# Patient Record
Sex: Female | Born: 1975 | Race: White | Hispanic: Yes | Smoking: Current some day smoker
Health system: Southern US, Community
[De-identification: ages and names within clinical notes are randomized; demographics above are authoritative.]

## PROBLEM LIST (undated history)

## (undated) DIAGNOSIS — F4310 Post-traumatic stress disorder, unspecified: Secondary | ICD-10-CM

## (undated) DIAGNOSIS — T7840XA Allergy, unspecified, initial encounter: Secondary | ICD-10-CM

## (undated) DIAGNOSIS — F329 Major depressive disorder, single episode, unspecified: Secondary | ICD-10-CM

## (undated) DIAGNOSIS — Z8619 Personal history of other infectious and parasitic diseases: Secondary | ICD-10-CM

## (undated) DIAGNOSIS — M461 Sacroiliitis, not elsewhere classified: Secondary | ICD-10-CM

## (undated) DIAGNOSIS — M8580 Other specified disorders of bone density and structure, unspecified site: Secondary | ICD-10-CM

## (undated) DIAGNOSIS — M797 Fibromyalgia: Secondary | ICD-10-CM

## (undated) DIAGNOSIS — M4200 Juvenile osteochondrosis of spine, site unspecified: Secondary | ICD-10-CM

## (undated) DIAGNOSIS — J301 Allergic rhinitis due to pollen: Secondary | ICD-10-CM

## (undated) DIAGNOSIS — G43909 Migraine, unspecified, not intractable, without status migrainosus: Secondary | ICD-10-CM

## (undated) DIAGNOSIS — R7303 Prediabetes: Secondary | ICD-10-CM

## (undated) DIAGNOSIS — F32A Depression, unspecified: Secondary | ICD-10-CM

## (undated) DIAGNOSIS — Z9189 Other specified personal risk factors, not elsewhere classified: Secondary | ICD-10-CM

## (undated) DIAGNOSIS — M7580 Other shoulder lesions, unspecified shoulder: Secondary | ICD-10-CM

## (undated) DIAGNOSIS — F509 Eating disorder, unspecified: Secondary | ICD-10-CM

## (undated) DIAGNOSIS — M1990 Unspecified osteoarthritis, unspecified site: Secondary | ICD-10-CM

## (undated) HISTORY — DX: Post-traumatic stress disorder, unspecified: F43.10

## (undated) HISTORY — PX: WISDOM TOOTH EXTRACTION: SHX21

## (undated) HISTORY — DX: Allergic rhinitis due to pollen: J30.1

## (undated) HISTORY — DX: Personal history of other infectious and parasitic diseases: Z86.19

## (undated) HISTORY — DX: Major depressive disorder, single episode, unspecified: F32.9

## (undated) HISTORY — PX: OVARIAN CYST REMOVAL: SHX89

## (undated) HISTORY — PX: DILATION AND CURETTAGE OF UTERUS: SHX78

## (undated) HISTORY — DX: Depression, unspecified: F32.A

## (undated) HISTORY — DX: Prediabetes: R73.03

## (undated) HISTORY — DX: Allergy, unspecified, initial encounter: T78.40XA

## (undated) HISTORY — DX: Other shoulder lesions, unspecified shoulder: M75.80

## (undated) HISTORY — DX: Migraine, unspecified, not intractable, without status migrainosus: G43.909

## (undated) HISTORY — DX: Other specified personal risk factors, not elsewhere classified: Z91.89

## (undated) HISTORY — DX: Eating disorder, unspecified: F50.9

## (undated) HISTORY — PX: ACHILLES TENDON SURGERY: SHX542

---

## 1997-09-14 ENCOUNTER — Emergency Department (HOSPITAL_COMMUNITY): Admission: EM | Admit: 1997-09-14 | Discharge: 1997-09-14 | Payer: Self-pay | Admitting: Emergency Medicine

## 1997-09-26 ENCOUNTER — Emergency Department (HOSPITAL_COMMUNITY): Admission: EM | Admit: 1997-09-26 | Discharge: 1997-09-26 | Payer: Self-pay | Admitting: Emergency Medicine

## 1997-12-25 ENCOUNTER — Inpatient Hospital Stay (HOSPITAL_COMMUNITY): Admission: AD | Admit: 1997-12-25 | Discharge: 1997-12-25 | Payer: Self-pay | Admitting: *Deleted

## 1997-12-28 ENCOUNTER — Emergency Department (HOSPITAL_COMMUNITY): Admission: EM | Admit: 1997-12-28 | Discharge: 1997-12-28 | Payer: Self-pay | Admitting: Emergency Medicine

## 1998-02-04 ENCOUNTER — Emergency Department (HOSPITAL_COMMUNITY): Admission: EM | Admit: 1998-02-04 | Discharge: 1998-02-04 | Payer: Self-pay | Admitting: Emergency Medicine

## 1998-02-09 ENCOUNTER — Emergency Department (HOSPITAL_COMMUNITY): Admission: EM | Admit: 1998-02-09 | Discharge: 1998-02-10 | Payer: Self-pay | Admitting: Emergency Medicine

## 1998-11-11 ENCOUNTER — Encounter: Payer: Self-pay | Admitting: Internal Medicine

## 1998-11-11 ENCOUNTER — Inpatient Hospital Stay (HOSPITAL_COMMUNITY): Admission: EM | Admit: 1998-11-11 | Discharge: 1998-11-13 | Payer: Self-pay | Admitting: Internal Medicine

## 1999-01-06 ENCOUNTER — Emergency Department (HOSPITAL_COMMUNITY): Admission: EM | Admit: 1999-01-06 | Discharge: 1999-01-06 | Payer: Self-pay | Admitting: Emergency Medicine

## 1999-01-31 ENCOUNTER — Other Ambulatory Visit: Admission: RE | Admit: 1999-01-31 | Discharge: 1999-01-31 | Payer: Self-pay | Admitting: *Deleted

## 1999-04-06 ENCOUNTER — Inpatient Hospital Stay (HOSPITAL_COMMUNITY): Admission: EM | Admit: 1999-04-06 | Discharge: 1999-04-09 | Payer: Self-pay | Admitting: *Deleted

## 1999-04-06 ENCOUNTER — Emergency Department (HOSPITAL_COMMUNITY): Admission: EM | Admit: 1999-04-06 | Discharge: 1999-04-06 | Payer: Self-pay | Admitting: Emergency Medicine

## 1999-05-24 ENCOUNTER — Emergency Department (HOSPITAL_COMMUNITY): Admission: EM | Admit: 1999-05-24 | Discharge: 1999-05-24 | Payer: Self-pay | Admitting: Emergency Medicine

## 1999-05-25 ENCOUNTER — Ambulatory Visit (HOSPITAL_COMMUNITY): Admission: RE | Admit: 1999-05-25 | Discharge: 1999-05-25 | Payer: Self-pay | Admitting: Emergency Medicine

## 1999-08-12 ENCOUNTER — Emergency Department (HOSPITAL_COMMUNITY): Admission: EM | Admit: 1999-08-12 | Discharge: 1999-08-12 | Payer: Self-pay | Admitting: Emergency Medicine

## 1999-09-05 ENCOUNTER — Inpatient Hospital Stay (HOSPITAL_COMMUNITY): Admission: AD | Admit: 1999-09-05 | Discharge: 1999-09-05 | Payer: Self-pay | Admitting: Obstetrics & Gynecology

## 2000-02-06 ENCOUNTER — Other Ambulatory Visit: Admission: RE | Admit: 2000-02-06 | Discharge: 2000-02-06 | Payer: Self-pay | Admitting: *Deleted

## 2000-02-23 ENCOUNTER — Emergency Department (HOSPITAL_COMMUNITY): Admission: EM | Admit: 2000-02-23 | Discharge: 2000-02-24 | Payer: Self-pay | Admitting: Emergency Medicine

## 2000-02-24 ENCOUNTER — Encounter: Payer: Self-pay | Admitting: Emergency Medicine

## 2000-03-02 ENCOUNTER — Ambulatory Visit (HOSPITAL_COMMUNITY): Admission: RE | Admit: 2000-03-02 | Discharge: 2000-03-02 | Payer: Self-pay | Admitting: Emergency Medicine

## 2000-03-02 ENCOUNTER — Encounter: Payer: Self-pay | Admitting: Emergency Medicine

## 2000-04-01 ENCOUNTER — Emergency Department (HOSPITAL_COMMUNITY): Admission: EM | Admit: 2000-04-01 | Discharge: 2000-04-01 | Payer: Self-pay | Admitting: Internal Medicine

## 2000-07-19 ENCOUNTER — Emergency Department (HOSPITAL_COMMUNITY): Admission: EM | Admit: 2000-07-19 | Discharge: 2000-07-19 | Payer: Self-pay | Admitting: Emergency Medicine

## 2000-08-16 ENCOUNTER — Emergency Department (HOSPITAL_COMMUNITY): Admission: EM | Admit: 2000-08-16 | Discharge: 2000-08-16 | Payer: Self-pay | Admitting: Emergency Medicine

## 2000-10-04 ENCOUNTER — Inpatient Hospital Stay (HOSPITAL_COMMUNITY): Admission: AD | Admit: 2000-10-04 | Discharge: 2000-10-04 | Payer: Self-pay | Admitting: Obstetrics & Gynecology

## 2000-10-07 ENCOUNTER — Inpatient Hospital Stay (HOSPITAL_COMMUNITY): Admission: AD | Admit: 2000-10-07 | Discharge: 2000-10-07 | Payer: Self-pay | Admitting: Obstetrics

## 2000-10-07 ENCOUNTER — Encounter: Payer: Self-pay | Admitting: Obstetrics

## 2000-10-08 ENCOUNTER — Encounter (INDEPENDENT_AMBULATORY_CARE_PROVIDER_SITE_OTHER): Payer: Self-pay

## 2000-10-08 ENCOUNTER — Inpatient Hospital Stay (HOSPITAL_COMMUNITY): Admission: AD | Admit: 2000-10-08 | Discharge: 2000-10-08 | Payer: Self-pay | Admitting: Obstetrics & Gynecology

## 2000-10-15 ENCOUNTER — Inpatient Hospital Stay (HOSPITAL_COMMUNITY): Admission: AD | Admit: 2000-10-15 | Discharge: 2000-10-15 | Payer: Self-pay | Admitting: Obstetrics & Gynecology

## 2000-10-22 ENCOUNTER — Inpatient Hospital Stay (HOSPITAL_COMMUNITY): Admission: AD | Admit: 2000-10-22 | Discharge: 2000-10-22 | Payer: Self-pay | Admitting: Obstetrics & Gynecology

## 2001-02-25 ENCOUNTER — Inpatient Hospital Stay (HOSPITAL_COMMUNITY): Admission: AD | Admit: 2001-02-25 | Discharge: 2001-02-25 | Payer: Self-pay | Admitting: Obstetrics

## 2001-03-01 ENCOUNTER — Inpatient Hospital Stay (HOSPITAL_COMMUNITY): Admission: AD | Admit: 2001-03-01 | Discharge: 2001-03-01 | Payer: Self-pay | Admitting: Obstetrics

## 2001-03-29 ENCOUNTER — Inpatient Hospital Stay (HOSPITAL_COMMUNITY): Admission: AD | Admit: 2001-03-29 | Discharge: 2001-03-29 | Payer: Self-pay | Admitting: Obstetrics

## 2001-05-13 ENCOUNTER — Emergency Department (HOSPITAL_COMMUNITY): Admission: EM | Admit: 2001-05-13 | Discharge: 2001-05-13 | Payer: Self-pay | Admitting: Emergency Medicine

## 2001-06-02 ENCOUNTER — Inpatient Hospital Stay (HOSPITAL_COMMUNITY): Admission: AD | Admit: 2001-06-02 | Discharge: 2001-06-02 | Payer: Self-pay | Admitting: Obstetrics

## 2001-06-16 ENCOUNTER — Inpatient Hospital Stay (HOSPITAL_COMMUNITY): Admission: AD | Admit: 2001-06-16 | Discharge: 2001-06-16 | Payer: Self-pay | Admitting: Obstetrics

## 2001-06-25 ENCOUNTER — Inpatient Hospital Stay (HOSPITAL_COMMUNITY): Admission: AD | Admit: 2001-06-25 | Discharge: 2001-06-25 | Payer: Self-pay | Admitting: Obstetrics

## 2001-07-17 ENCOUNTER — Inpatient Hospital Stay (HOSPITAL_COMMUNITY): Admission: AD | Admit: 2001-07-17 | Discharge: 2001-07-17 | Payer: Self-pay | Admitting: Obstetrics

## 2001-07-25 ENCOUNTER — Inpatient Hospital Stay (HOSPITAL_COMMUNITY): Admission: AD | Admit: 2001-07-25 | Discharge: 2001-07-27 | Payer: Self-pay | Admitting: Obstetrics

## 2001-07-25 ENCOUNTER — Encounter (INDEPENDENT_AMBULATORY_CARE_PROVIDER_SITE_OTHER): Payer: Self-pay | Admitting: Specialist

## 2001-11-06 ENCOUNTER — Encounter: Payer: Self-pay | Admitting: *Deleted

## 2001-11-06 ENCOUNTER — Emergency Department (HOSPITAL_COMMUNITY): Admission: EM | Admit: 2001-11-06 | Discharge: 2001-11-06 | Payer: Self-pay | Admitting: *Deleted

## 2001-11-22 ENCOUNTER — Emergency Department (HOSPITAL_COMMUNITY): Admission: EM | Admit: 2001-11-22 | Discharge: 2001-11-22 | Payer: Self-pay | Admitting: Emergency Medicine

## 2001-12-12 ENCOUNTER — Emergency Department (HOSPITAL_COMMUNITY): Admission: EM | Admit: 2001-12-12 | Discharge: 2001-12-12 | Payer: Self-pay | Admitting: Emergency Medicine

## 2002-01-22 ENCOUNTER — Emergency Department (HOSPITAL_COMMUNITY): Admission: EM | Admit: 2002-01-22 | Discharge: 2002-01-22 | Payer: Self-pay | Admitting: Emergency Medicine

## 2002-01-22 ENCOUNTER — Encounter: Payer: Self-pay | Admitting: Emergency Medicine

## 2002-02-04 ENCOUNTER — Encounter: Admission: RE | Admit: 2002-02-04 | Discharge: 2002-02-04 | Payer: Self-pay | Admitting: *Deleted

## 2002-02-24 ENCOUNTER — Emergency Department (HOSPITAL_COMMUNITY): Admission: EM | Admit: 2002-02-24 | Discharge: 2002-02-24 | Payer: Self-pay | Admitting: Emergency Medicine

## 2002-03-14 ENCOUNTER — Encounter: Admission: RE | Admit: 2002-03-14 | Discharge: 2002-03-14 | Payer: Self-pay | Admitting: *Deleted

## 2002-05-02 ENCOUNTER — Encounter: Admission: RE | Admit: 2002-05-02 | Discharge: 2002-05-02 | Payer: Self-pay | Admitting: *Deleted

## 2002-06-01 ENCOUNTER — Encounter: Admission: RE | Admit: 2002-06-01 | Discharge: 2002-06-01 | Payer: Self-pay | Admitting: *Deleted

## 2002-06-16 ENCOUNTER — Emergency Department (HOSPITAL_COMMUNITY): Admission: EM | Admit: 2002-06-16 | Discharge: 2002-06-16 | Payer: Self-pay | Admitting: Emergency Medicine

## 2002-08-14 ENCOUNTER — Emergency Department (HOSPITAL_COMMUNITY): Admission: EM | Admit: 2002-08-14 | Discharge: 2002-08-14 | Payer: Self-pay | Admitting: Emergency Medicine

## 2002-08-14 ENCOUNTER — Encounter: Payer: Self-pay | Admitting: Emergency Medicine

## 2002-09-16 ENCOUNTER — Encounter: Admission: RE | Admit: 2002-09-16 | Discharge: 2002-09-16 | Payer: Self-pay | Admitting: *Deleted

## 2002-10-31 ENCOUNTER — Emergency Department (HOSPITAL_COMMUNITY): Admission: EM | Admit: 2002-10-31 | Discharge: 2002-10-31 | Payer: Self-pay | Admitting: Emergency Medicine

## 2002-10-31 ENCOUNTER — Encounter: Payer: Self-pay | Admitting: Emergency Medicine

## 2003-10-11 ENCOUNTER — Emergency Department (HOSPITAL_COMMUNITY): Admission: EM | Admit: 2003-10-11 | Discharge: 2003-10-11 | Payer: Self-pay | Admitting: Emergency Medicine

## 2003-10-25 ENCOUNTER — Emergency Department (HOSPITAL_COMMUNITY): Admission: EM | Admit: 2003-10-25 | Discharge: 2003-10-25 | Payer: Self-pay | Admitting: Emergency Medicine

## 2003-10-28 ENCOUNTER — Ambulatory Visit: Payer: Self-pay | Admitting: Psychiatry

## 2003-10-28 ENCOUNTER — Inpatient Hospital Stay (HOSPITAL_COMMUNITY): Admission: AD | Admit: 2003-10-28 | Discharge: 2003-11-07 | Payer: Self-pay | Admitting: Psychiatry

## 2003-10-28 ENCOUNTER — Encounter: Payer: Self-pay | Admitting: Emergency Medicine

## 2003-11-02 ENCOUNTER — Encounter: Payer: Self-pay | Admitting: Internal Medicine

## 2003-11-11 ENCOUNTER — Emergency Department (HOSPITAL_COMMUNITY): Admission: EM | Admit: 2003-11-11 | Discharge: 2003-11-11 | Payer: Self-pay | Admitting: Emergency Medicine

## 2003-11-12 ENCOUNTER — Emergency Department (HOSPITAL_COMMUNITY): Admission: EM | Admit: 2003-11-12 | Discharge: 2003-11-12 | Payer: Self-pay | Admitting: Emergency Medicine

## 2006-12-30 ENCOUNTER — Inpatient Hospital Stay (HOSPITAL_COMMUNITY): Admission: RE | Admit: 2006-12-30 | Discharge: 2007-01-02 | Payer: Self-pay | Admitting: Obstetrics

## 2008-11-29 ENCOUNTER — Encounter: Admission: RE | Admit: 2008-11-29 | Discharge: 2008-11-29 | Payer: Self-pay | Admitting: Neurosurgery

## 2010-05-21 NOTE — Op Note (Signed)
NAME:  Gail Watkins, Gail Watkins              ACCOUNT NO.:  [id number]   MEDICAL RECORD NO.:  [id number]          PATIENT TYPE:  INP   LOCATION:  9199                          FACILITY:  [facility]   PHYSICIAN:  Kathreen Cosier, M.D.DATE OF BIRTH:  10-Jul-1975   DATE OF PROCEDURE:  12/30/2006  DATE OF DISCHARGE:                               OPERATIVE REPORT   PREOPERATIVE DIAGNOSES:  1. Previous cesarean section.  2. At term.  3. Desires repeat cesarean section.   POSTOPERATIVE DIAGNOSES:  1. Previous cesarean section.  2. At term.  3. Desires repeat cesarean section.   SURGEON:  Kathreen Cosier, M.D.   ANESTHESIA:  Spinal.   PROCEDURE IN DETAIL:  The patient was placed on the operating table in a  supine position.  The abdomen was prepped and draped.  The bladder  emptied with a Foley catheter.  A transverse suprapubic incision was  made through the old scar and carried down to the rectus fascia.  The  fascia was cleaned and incised the length of the incision.  The recti  muscles were retracted laterally.  The peritoneum was incised  longitudinally.   A transverse incision was made in the visceral peritoneum above the  bladder.  The bladder was mobilized inferiorly.  A transverse lower  uterine incision was made.  The patient was delivered from the left  occiput anterior position of a female with Apgar scores of 8 and 9 and  weighing 6 pounds 2 ounces.  The team was in attendance.  The fluid was  clear.  The placenta was posterior fundal, removed manually.  The cavity  was cleaned with dry laps.  The placenta was sent to labor and delivery.   The uterine cavity was closed in one layer with continuous suture of #1  chromic.  Hemostasis was satisfactory.  The bladder flap was reattached  with 2-0 chromic.  The uterus was contracted.  The tubes and ovaries  were normal.  The abdomen was closed in layers.  The peritoneum was  closed with a continuous suture of 0 chromic.  The fascia  was closed  with a continuous suture of 0 Dexon and the skin was closed with a  subcuticular stitch of 4-0 Monocryl.  Blood loss was 600 mL.           ______________________________  Kathreen Cosier, M.D.     BAM/MEDQ  D:  12/30/2006  T:  12/30/2006  Job:  [id number]

## 2010-05-24 NOTE — Discharge Summary (Signed)
NAME:  Gail Watkins, Gail Watkins              ACCOUNT NO.:  [id number]   MEDICAL RECORD NO.:  [id number]          PATIENT TYPE:  INP   LOCATION:  9107                          [facility]ACILITY:  [facility]   PHYSICIAN:  Kathreen Cosier, M.D.DATE OF BIRTH:  September 27, 1975   DATE OF ADMISSION:  12/30/2006  DATE OF DISCHARGE:  01/02/2007                               DISCHARGE SUMMARY   The patient is a 35 year old gravida 7, para 2-0-4-2.  [facility] January 08, 2007.  Positive GBS.  She had previous section and desired a repeat C-  section at term.   She was on:  1. Lexapro 20 mg daily.  2. Klonopin 1 mg b.i.d.   She had a history of:  1. Bipolar disorder.  2. Panic disorder.  3. Obsessive/compulsive disorder.  4. Juvenile kyphosis.  5. History of a previous uterine perforation.   She underwent a repeat low-transverse cesarean section on December 30, 2006 and had a 6 pounds 2 ounces female with Apgar's of 8 and 9.  Postpartum, she did well.   On admission her hemoglobin was 11.5, white count 12.1.  Post-op 8.9  hemoglobin, 11.9 white count.  PT PTT normal.  RPR nonreactive.  HIV  nonreactive.   She was discharged home on the 3rd postoperative day on a regular diet  and will return to see me in 6 weeks.   DISCHARGE DIAGNOSES:  Status post repeat low-transverse cesarean section  at term.           ______________________________  Kathreen Cosier, M.D.     BAM/MEDQ  D:  01/21/2007  T:  01/21/2007  Job:  [id number]

## 2010-05-24 NOTE — Procedures (Signed)
HISTORY:  This patient is 35 year old with a history of polysubstance abuse,  obsessive compulsive disorder, depression with history of seizures. The  patient is being evaluated for seizures at this point. This is again a  portable EEG recording. No skull infection noted.   MEDICATIONS:  Multivitamins, thiamine, Librium, Seroquel, Neurontin,  Remeron, Effexor, Vibramycin, Tylenol, Dulcolax.   EEG CLASSIFICATION:  Normal awake __________ .   DESCRIPTION OF RECORDING:  The background rhythm of this recording consists  of a fairly well-modulated medium amplitude alpha rhythm of 9 Hz that is  reactive to eye opening and closure.  As record progresses, the patient  appears to remain in the awakened state throughout the entirety of this  recording.  Photic stimulation was not performed.  Hyperventilation was  performed resulting in a very minimal buildup of background rhythm activity  without significant slowing seen.  At no time during the recording does  there appear to be evidence of spike or spike wave discharges or evidence of  focal slowing.  EKG monitor shows no evidence of cardiac arrhythmias with a  heart rate of 68.   IMPRESSION:  This is a normal EEG recording in the __________ state. No  evidence of ictal or interictal discharges were seen on today's evaluation.      ZOX:WRUE  D:  11/01/2003 17:45:17  T:  11/01/2003 18:15:34  Job #:  [id number]
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
## 2010-10-11 LAB — DIFFERENTIAL
Basophils Absolute: 0
Eosinophils Absolute: 0.1 — ABNORMAL LOW
Lymphocytes Relative: 16
Lymphs Abs: 1.9
Neutrophils Relative %: 76

## 2010-10-11 LAB — CBC
HCT: 26.5 — ABNORMAL LOW
HCT: 33.4 — ABNORMAL LOW
Hemoglobin: 11.5 — ABNORMAL LOW
MCHC: 34.5
MCV: 87.6
MCV: 88.2
Platelets: 152
Platelets: 193
RBC: 3.82 — ABNORMAL LOW
RDW: 12.6
RDW: 12.7
WBC: 11.9 — ABNORMAL HIGH
WBC: 12.1 — ABNORMAL HIGH

## 2010-10-11 LAB — PROTIME-INR
INR: 1
Prothrombin Time: 13.1

## 2010-10-11 LAB — RPR: RPR Ser Ql: NONREACTIVE

## 2015-01-20 ENCOUNTER — Encounter (HOSPITAL_COMMUNITY): Payer: Self-pay

## 2015-01-20 ENCOUNTER — Emergency Department (HOSPITAL_COMMUNITY): Payer: Medicaid Other

## 2015-01-20 ENCOUNTER — Emergency Department (HOSPITAL_COMMUNITY)
Admission: EM | Admit: 2015-01-20 | Discharge: 2015-01-20 | Discharge status: Against Medical Advice | Payer: Medicaid Other | Attending: Emergency Medicine | Admitting: Emergency Medicine

## 2015-01-20 DIAGNOSIS — F17200 Nicotine dependence, unspecified, uncomplicated: Secondary | ICD-10-CM | POA: Diagnosis not present

## 2015-01-20 DIAGNOSIS — W01198A Fall on same level from slipping, tripping and stumbling with subsequent striking against other object, initial encounter: Secondary | ICD-10-CM | POA: Diagnosis not present

## 2015-01-20 DIAGNOSIS — Y9383 Activity, rough housing and horseplay: Secondary | ICD-10-CM | POA: Diagnosis not present

## 2015-01-20 DIAGNOSIS — Y9289 Other specified places as the place of occurrence of the external cause: Secondary | ICD-10-CM | POA: Insufficient documentation

## 2015-01-20 DIAGNOSIS — M533 Sacrococcygeal disorders, not elsewhere classified: Secondary | ICD-10-CM

## 2015-01-20 DIAGNOSIS — S199XXA Unspecified injury of neck, initial encounter: Secondary | ICD-10-CM | POA: Insufficient documentation

## 2015-01-20 DIAGNOSIS — Z8739 Personal history of other diseases of the musculoskeletal system and connective tissue: Secondary | ICD-10-CM | POA: Diagnosis not present

## 2015-01-20 DIAGNOSIS — Y998 Other external cause status: Secondary | ICD-10-CM | POA: Diagnosis not present

## 2015-01-20 DIAGNOSIS — S3992XA Unspecified injury of lower back, initial encounter: Secondary | ICD-10-CM | POA: Insufficient documentation

## 2015-01-20 DIAGNOSIS — M545 Low back pain: Secondary | ICD-10-CM

## 2015-01-20 HISTORY — DX: Juvenile osteochondrosis of spine, site unspecified: M42.00

## 2015-01-20 HISTORY — DX: Fibromyalgia: M79.7

## 2015-01-20 MED ORDER — ACETAMINOPHEN 500 MG PO TABS: 500.0000 mg | ORAL_TABLET | Freq: Four times a day (QID) | ORAL | Status: DC | PRN

## 2015-01-20 MED ORDER — IBUPROFEN 400 MG PO TABS: 800.0000 mg | ORAL_TABLET | Freq: Once | ORAL | Status: DC

## 2015-01-20 MED ORDER — NAPROXEN 500 MG PO TABS: 500.0000 mg | ORAL_TABLET | Freq: Two times a day (BID) | ORAL | Status: DC

## 2015-01-20 NOTE — ED Notes (Signed)
Patient transported to X-ray 

## 2015-01-20 NOTE — ED Notes (Signed)
PA at bedside.
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
## 2015-01-20 NOTE — ED Notes (Signed)
Pt here for fall while "horse playing" with her son. She states she fell onto concrete floor and landed on her bottom and reports lower back pain. No head injury, no LOC. Pt states the fall made her a little nauseous immediately afterwards.

## 2015-01-20 NOTE — ED Notes (Signed)
RN went to room to obtain pt sticker for ordering of donut cushion; pt was upset and states she has a pillow and can get aleve at home; Pt states she was leaving; RN witnessed pt and family walking out exit; PA notified

## 2015-07-08 ENCOUNTER — Encounter (HOSPITAL_BASED_OUTPATIENT_CLINIC_OR_DEPARTMENT_OTHER): Payer: Self-pay | Admitting: *Deleted

## 2015-07-08 ENCOUNTER — Emergency Department (HOSPITAL_BASED_OUTPATIENT_CLINIC_OR_DEPARTMENT_OTHER)
Admission: EM | Admit: 2015-07-08 | Discharge: 2015-07-08 | Disposition: A | Discharge status: Home / Self Care | Payer: Medicaid Other | Attending: Emergency Medicine | Admitting: Emergency Medicine

## 2015-07-08 DIAGNOSIS — Z79899 Other long term (current) drug therapy: Secondary | ICD-10-CM | POA: Diagnosis not present

## 2015-07-08 DIAGNOSIS — Y9301 Activity, walking, marching and hiking: Secondary | ICD-10-CM | POA: Insufficient documentation

## 2015-07-08 DIAGNOSIS — M1990 Unspecified osteoarthritis, unspecified site: Secondary | ICD-10-CM | POA: Diagnosis not present

## 2015-07-08 DIAGNOSIS — S91312A Laceration without foreign body, left foot, initial encounter: Secondary | ICD-10-CM | POA: Diagnosis present

## 2015-07-08 DIAGNOSIS — X58XXXA Exposure to other specified factors, initial encounter: Secondary | ICD-10-CM | POA: Diagnosis not present

## 2015-07-08 DIAGNOSIS — Y999 Unspecified external cause status: Secondary | ICD-10-CM | POA: Insufficient documentation

## 2015-07-08 DIAGNOSIS — Z79818 Long term (current) use of other agents affecting estrogen receptors and estrogen levels: Secondary | ICD-10-CM | POA: Insufficient documentation

## 2015-07-08 DIAGNOSIS — F17200 Nicotine dependence, unspecified, uncomplicated: Secondary | ICD-10-CM | POA: Diagnosis not present

## 2015-07-08 DIAGNOSIS — Y929 Unspecified place or not applicable: Secondary | ICD-10-CM | POA: Diagnosis not present

## 2015-07-08 DIAGNOSIS — S91115A Laceration without foreign body of left lesser toe(s) without damage to nail, initial encounter: Secondary | ICD-10-CM | POA: Insufficient documentation

## 2015-07-08 DIAGNOSIS — Z791 Long term (current) use of non-steroidal anti-inflammatories (NSAID): Secondary | ICD-10-CM | POA: Insufficient documentation

## 2015-07-08 DIAGNOSIS — S81802A Unspecified open wound, left lower leg, initial encounter: Secondary | ICD-10-CM

## 2015-07-08 HISTORY — DX: Sacroiliitis, not elsewhere classified: M46.1

## 2015-07-08 HISTORY — DX: Other specified disorders of bone density and structure, unspecified site: M85.80

## 2015-07-08 HISTORY — DX: Unspecified osteoarthritis, unspecified site: M19.90

## 2015-07-08 NOTE — ED Notes (Signed)
Pt states that she had a fall in Jan and since then she states her "walking has been off" causing calluses on her left foot/and losing toe nails on both feet. C/o open area below 5th toe on pedal aspect of foot. States she is not able to get area to close. Has been that way for weeks.

## 2015-07-08 NOTE — Discharge Instructions (Signed)
Keep wound clean and dry. Limited mobility of the toes. Follow-up with your primary physician  SEEK MEDICAL CARE IF:  You develop unusual or increased swelling or redness around the wound.  You have increasing pain or tenderness.  There is increasing fluid (drainage) or a bad smelling drainage coming from the wound.   This information is not intended to replace advice given to you by your health care provider. Make sure you discuss any questions you have with your health care provider.   Document Released: 12/23/2004 Document Revised: 12/28/2012 Document Reviewed: 06/22/2012 Elsevier Interactive Patient Education [employer]2016 Elsevier Inc.

## 2015-07-08 NOTE — ED Provider Notes (Signed)
CSN: [id number]     Arrival date & time 07/08/15  1425 History  By signing my name below, I, Gail Watkins, attest that this documentation has been prepared under the direction and in the presence of Loren Raceravid Uel Davidow, MD. Electronically Signed: Alyssa GroveMartin Watkins, ED Scribe. 07/08/2015. 4:03 PM.   Chief Complaint  Patient presents with  . foot injury left     The history is provided by the patient. No language interpreter was used.    HPI Comments: Gail Watkins is a 40 y.o. female with PMHx of Sacroiliitis who presents to the Emergency Department complaining of laceration on her left little toe onset 3 weeks ago. Pt reports that the wound is a result of friction of a callus that she obtained earlier this year.  Patient states that the wound was healing but that she reopened it after getting her feet wet. No redness or warmth. No fever or chills. Denies any other injuries.   Past Medical History  Diagnosis Date  . Scheurmann's disease   . Fibromyalgia   . Sacroiliitis (HCC)   . Osteoarthritis   . Osteopenia    Past Surgical History  Procedure Laterality Date  . Cesarean section    . Cystectomy      removed from ovaries  . Dilation and curettage of uterus    . Ovarian cyst removal     No family history on file. Social History  Substance Use Topics  . Smoking status: Current Every Day Smoker  . Smokeless tobacco: None  . Alcohol Use: Yes     Comment: rare   OB History    No data available     Review of Systems  Constitutional: Negative for fever.  Musculoskeletal: Negative for arthralgias.  Skin: Positive for wound. Negative for color change and rash.  Neurological: Negative for weakness and numbness.  All other systems reviewed and are negative.   Allergies  Sulfa antibiotics; Vancomycin; Prednisone; Reglan; and Terbutaline  Home Medications   Prior to Admission medications   Medication Sig Start Date End Date Taking? Authorizing Provider  clonazePAM (KLONOPIN) 1 MG  tablet Take 1 mg by mouth 2 (two) times daily.   Yes Historical Provider, MD  Diclofenac Sodium (VOLTAREN PO) Take by mouth.   Yes Historical Provider, MD  ergocalciferol (VITAMIN D2) 50000 units capsule Take 50,000 Units by mouth once a week.   Yes Historical Provider, MD  escitalopram (LEXAPRO) 10 MG tablet Take 10 mg by mouth daily.   Yes Historical Provider, MD  norethindrone-ethinyl estradiol (JUNEL FE,GILDESS FE,LOESTRIN FE) 1-20 MG-MCG tablet Take 1 tablet by mouth daily.   Yes Historical Provider, MD  OxyCODONE HCl, Abuse Deter, (OXAYDO) 5 MG TABA Take by mouth.   Yes Historical Provider, MD  acetaminophen (TYLENOL) 500 MG tablet Take 1 tablet (500 mg total) by mouth every 6 (six) hours as needed. 01/20/15   Cheri FowlerKayla Rose, PA-C  naproxen (NAPROSYN) 500 MG tablet Take 1 tablet (500 mg total) by mouth 2 (two) times daily. 01/20/15   Kayla Rose, PA-C   BP 110/70 mmHg  Pulse 79  Temp(Src) 98.1 F (36.7 C)  Resp 18  Ht 6' (1.829 m)  Wt 185 lb (83.915 kg)  BMI 25.08 kg/m2  SpO2 100%  LMP 06/25/2015 Physical Exam  Constitutional: She is oriented to person, place, and time. She appears well-developed and well-nourished. No distress.  HENT:  Head: Normocephalic and atraumatic.  Mouth/Throat: Oropharynx is clear and moist.  Eyes: EOM are normal. Pupils are equal,  round, and reactive to light.  Neck: Normal range of motion. Neck supple.  Cardiovascular: Normal rate and regular rhythm.   Pulmonary/Chest: Effort normal and breath sounds normal.  Abdominal: Soft. Bowel sounds are normal.  Musculoskeletal: Normal range of motion. She exhibits no edema or tenderness.  Neurological: She is alert and oriented to person, place, and time.  Skin: Skin is warm and dry. No rash noted. No erythema.  Patient has 1 cm laceration to the base of the fifth digit of the left foot at the plantar surface. Appears to be chronic in nature and healing. No evidence of erythema, swelling or warmth.   Psychiatric: She  has a normal mood and affect. Her behavior is normal.  Nursing note and vitals reviewed.   ED Course  Procedures (including critical care time)  DIAGNOSTIC STUDIES: Oxygen Saturation is 100% on RA, normal by my interpretation.    COORDINATION OF CARE: 3:29 PM Discussed treatment plan with pt at bedside which includes keeping wound dry and clean as well as limiting mobility and pt agreed to plan.  Labs Review Labs Reviewed - No data to display  Imaging Review No results found. I have personally reviewed and evaluated these images and lab results as part of my medical decision-making.   EKG Interpretation None      MDM   Final diagnoses:  Non-healing wound of lower extremity, left, initial encounter    I personally performed the services described in this documentation, which was scribed in my presence. The recorded information has been reviewed and is accurate.   Patient with chronic wound to the left foot. No evidence of infection and it appears that she keeps reopening the wound with activity and getting her feet wet. She is advised to keep the area dry and clean. She should limit movement of the toes and follow-up with her primary physician. Wound was dressed in the emergency department. Return precautions given.  Loren Raceravid Rhett Najera, MD 07/08/15 [phone]

## 2015-07-08 NOTE — ED Notes (Signed)
Per pt report ongoing issue with cut between small toe and next to last toe on lt foot. Pt reports using new skin over the area, has seen pcp with no change in care, now burning, unable to walk and has been using more of her chronic pain medications.

## 2016-02-19 ENCOUNTER — Other Ambulatory Visit (HOSPITAL_COMMUNITY): Payer: Self-pay | Admitting: Advanced Practice Midwife

## 2016-02-19 DIAGNOSIS — R102 Pelvic and perineal pain: Secondary | ICD-10-CM

## 2016-02-28 ENCOUNTER — Ambulatory Visit (HOSPITAL_COMMUNITY)
Admission: RE | Admit: 2016-02-28 | Discharge: 2016-02-28 | Disposition: A | Discharge status: Home / Self Care | Payer: Medicaid Other | Source: Ambulatory Visit | Attending: Advanced Practice Midwife | Admitting: Advanced Practice Midwife

## 2016-02-28 DIAGNOSIS — M797 Fibromyalgia: Secondary | ICD-10-CM | POA: Insufficient documentation

## 2016-02-28 DIAGNOSIS — N854 Malposition of uterus: Secondary | ICD-10-CM | POA: Diagnosis not present

## 2016-02-28 DIAGNOSIS — R102 Pelvic and perineal pain: Secondary | ICD-10-CM | POA: Insufficient documentation

## 2016-02-28 DIAGNOSIS — F17200 Nicotine dependence, unspecified, uncomplicated: Secondary | ICD-10-CM | POA: Insufficient documentation

## 2016-04-01 ENCOUNTER — Ambulatory Visit (INDEPENDENT_AMBULATORY_CARE_PROVIDER_SITE_OTHER): Payer: Medicaid Other | Admitting: Obstetrics & Gynecology

## 2016-04-01 VITALS — BP 108/70 | HR 68 | Wt 187.5 lb

## 2016-04-01 DIAGNOSIS — R102 Pelvic and perineal pain: Secondary | ICD-10-CM | POA: Diagnosis present

## 2016-04-01 DIAGNOSIS — G8929 Other chronic pain: Secondary | ICD-10-CM | POA: Diagnosis not present
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
## 2016-04-02 LAB — POCT PREGNANCY, URINE: PREG TEST UR: NEGATIVE

## 2016-08-18 ENCOUNTER — Encounter: Payer: Self-pay | Admitting: Family Medicine

## 2016-08-18 ENCOUNTER — Ambulatory Visit (INDEPENDENT_AMBULATORY_CARE_PROVIDER_SITE_OTHER): Payer: BLUE CROSS/BLUE SHIELD | Admitting: Family Medicine

## 2016-08-18 VITALS — BP 124/80 | HR 91 | Temp 98.3°F | Ht 72.0 in | Wt 187.4 lb

## 2016-08-18 DIAGNOSIS — M4807 Spinal stenosis, lumbosacral region: Secondary | ICD-10-CM

## 2016-08-18 DIAGNOSIS — M545 Low back pain: Secondary | ICD-10-CM

## 2016-08-18 DIAGNOSIS — M461 Sacroiliitis, not elsewhere classified: Secondary | ICD-10-CM | POA: Diagnosis not present

## 2016-08-18 DIAGNOSIS — K589 Irritable bowel syndrome without diarrhea: Secondary | ICD-10-CM

## 2016-08-18 DIAGNOSIS — G8929 Other chronic pain: Secondary | ICD-10-CM

## 2016-08-18 DIAGNOSIS — F445 Conversion disorder with seizures or convulsions: Secondary | ICD-10-CM | POA: Diagnosis not present

## 2016-08-18 MED ORDER — MELOXICAM 7.5 MG PO TABS: 7.5000 mg | ORAL_TABLET | Freq: Two times a day (BID) | ORAL | 0 refills | Status: DC

## 2016-08-18 MED ORDER — TRAMADOL HCL 50 MG PO TABS: 50.0000 mg | ORAL_TABLET | Freq: Four times a day (QID) | ORAL | 1 refills | Status: DC | PRN

## 2016-08-18 MED ORDER — OMEPRAZOLE 40 MG PO CPDR: 40.0000 mg | DELAYED_RELEASE_CAPSULE | Freq: Every day | ORAL | 2 refills | Status: DC

## 2016-08-18 MED ORDER — DIAZEPAM 5 MG PO TABS: ORAL_TABLET | ORAL | 0 refills | Status: DC

## 2016-08-18 MED ORDER — DICYCLOMINE HCL 10 MG PO CAPS: 10.0000 mg | ORAL_CAPSULE | Freq: Three times a day (TID) | ORAL | 2 refills | Status: DC

## 2016-08-18 NOTE — Progress Notes (Addendum)
Chief Complaint  Patient presents with  . Establish Care    pt wants to be referred back to pain managment       New Patient Visit SUBJECTIVE: HPI: Gail Watkins is an 41 y.o.female who is being seen for establishing care.  The patient was previously seen at Dr. Kathrynn SpeedArvind. There was a miscommunication and she stopped seeing him.  Chronic back pain Long standing hx of back pain, used to have Medicaid. Saw pain management and he wanted MRI. For insurance to pay for this, she had to go through PT. Insurance did not cover PT. She denies any loss of bowel/bladder function. She is interested in having physical therapy. She would also like to have an MRI done. MRI was to rule out spinal stenosis. She did have a fall onto her tailbone that started this.  She has a history of nonepileptic seizures stemming from her PTSD. She has had a negative EEG in the past. Her previous neurologist lost his position as the previous health care organization was downsizing. She likely referral back to neurology. She needs a neurologist to say that her seizures are nonepileptic.   Allergies  Allergen Reactions  . Sulfa Antibiotics Anaphylaxis       . Vancomycin Anaphylaxis  . Prednisone     PTSD  . Reglan [Metoclopramide]     "it makes me feel like im crawling out of my skin"   . Terbutaline     "it makes me feel like im crawling out of my skin"     Past Medical History:  Diagnosis Date  . Allergy   . Depression   . Eating disorder   . Fibromyalgia   . Hay fever   . History of chicken pox   . History of fainting spells of unknown cause   . Migraine   . Osteoarthritis   . Osteopenia   . Sacroiliitis (HCC)   . Scheurmann's disease    Past Surgical History:  Procedure Laterality Date  . CESAREAN SECTION    . cystectomy     removed from ovaries  . DILATION AND CURETTAGE OF UTERUS    . OVARIAN CYST REMOVAL     Social History   Social History  . Marital status: Single   Social History Main  Topics  . Smoking status: Current Every Day Smoker  . Smokeless tobacco: Never Used  . Alcohol use Yes     Comment: rare  . Drug use: No   Family History  Problem Relation Age of Onset  . Alzheimer's disease Mother        Epo4+  . Microcephaly Father   . Heart attack Father   . Cancer Father        Prostate  . Stroke Father   . Diabetes Father   . Mental illness Father      Current Outpatient Prescriptions:  .  Diclofenac Sodium (VOLTAREN PO), Take by mouth., Disp: , Rfl:  .  dicyclomine (BENTYL) 10 MG capsule, Take 1 capsule (10 mg total) by mouth 4 (four) times daily -  before meals and at bedtime., Disp: 120 capsule, Rfl: 2 .  ergocalciferol (VITAMIN D2) 50000 units capsule, Take 50,000 Units by mouth once a week., Disp: , Rfl:  .  Melatonin 5 MG TABS, Take 10 tablets by mouth at bedtime., Disp: , Rfl:  .  meloxicam (MOBIC) 7.5 MG tablet, Take 1 tablet (7.5 mg total) by mouth 2 (two) times daily., Disp: 30 tablet, Rfl: 0 .  omeprazole (PRILOSEC) 40 MG capsule, Take 1 capsule (40 mg total) by mouth daily., Disp: 30 capsule, Rfl: 2 .  traMADol (ULTRAM) 50 MG tablet, Take 1 tablet (50 mg total) by mouth every 6 (six) hours as needed for moderate pain., Disp: 30 tablet, Rfl: 1 .  diazepam (VALIUM) 5 MG tablet, Take 1 tab 30 min prior to your MRI., Disp: 1 tablet, Rfl: 0  Patient's last menstrual period was 08/12/2016 (exact date).  ROS MSK: +SI joint pain, +LBP  Neuro: Denies weakness   OBJECTIVE: BP 124/80 (BP Location: Left Arm, Patient Position: Sitting, Cuff Size: Normal)   Pulse 91   Temp 98.3 F (36.8 C) (Oral)   Ht 6' (1.829 m)   Wt 187 lb 6.4 oz (85 kg)   LMP 08/12/2016 (Exact Date)   SpO2 98%   BMI 25.42 kg/m   Constitutional: -  VS reviewed -  Well developed, well nourished, appears stated age -  No apparent distress  Psychiatric: -  Oriented to person, place, and time -  Memory intact -  Affect and mood normal -  Fluent conversation, good eye  contact -  Judgment and insight age appropriate  Eye: -  Conjunctivae clear, no discharge -  Pupils symmetric, round, reactive to light  ENMT: -  MMM    Pharynx moist, no exudate, no erythema  Neck: -  No gross swelling, no palpable masses -  Thyroid midline, not enlarged, mobile, no palpable masses  Cardiovascular: -  RRR -  No LE edema  Respiratory: -  Normal respiratory effort, no accessory muscle use, no retraction -  Breath sounds equal, no wheezes, no ronchi, no crackles  Neurological:  -  CN II - XII grossly intact -  Sensation grossly intact to light touch, equal bilaterally -  2/4 patellar reflex, 1/4 calcaneal reflex, no clonus -  No cerebellar signs  Musculoskeletal: -  No clubbing, no cyanosis -  Gait normal -  5/5 strength throughout -  Mild TTP over b/l SI jt, worse on L -  +TTP over lumbar paraspinals around L4/5  Skin: -  No significant lesion on inspection -  Warm and dry to palpation   ASSESSMENT/PLAN: Psychogenic nonepileptic seizure - Plan: Ambulatory referral to Neurology  Spinal stenosis of lumbosacral region - Plan: MR Lumbar Spine W Wo Contrast  Chronic bilateral low back pain without sciatica - Plan: Ambulatory referral to Physical Therapy  Irritable bowel syndrome, unspecified type  Sacroiliitis (HCC) - Plan: Ambulatory referral to Physical Therapy  Patient instructed to sign release of records form from her previous PCP. Refer to PT, Neuro, MRI for pain management. She needs a neurologist to communicate to her pain provider that her seizures are non-epileptic.  I gave her resources for both psych and counseling services here. I am OK with tramadol for pain until she can be treated further by pain management. Patient should return for CPE at earliest convenience. The patient voiced understanding and agreement to the plan.  Greater than 38 minutes were spent face to face with the patient with greater than 50% of this time spent counseling on SI jt  pain, chronic low back pain, prognosis, follow up, anxiety, psychiatry and non-epileptic seizure management.     Jilda Roche [place], DO 08/18/16  5:31 PM

## 2016-08-18 NOTE — Patient Instructions (Addendum)
[facility] [address] [place], [place] [address] [phone]  [facility] [address] [place], [place] [address] [phone]  [facility] [address] [place], [place] [address] [phone]  Dr. Andee PolesParish McKinney [address] [place], [place] [address] [phone]  Please consider counseling. The medical literature and evidence-based guidelines support it. Contact [phone] to schedule an appointment or inquire about cost/insurance coverage.  If you do not hear anything about your Neurology referral in the next 1-2 weeks, call our office and ask for an update.

## 2016-08-23 ENCOUNTER — Ambulatory Visit (HOSPITAL_BASED_OUTPATIENT_CLINIC_OR_DEPARTMENT_OTHER): Payer: BLUE CROSS/BLUE SHIELD

## 2016-08-30 ENCOUNTER — Ambulatory Visit (HOSPITAL_BASED_OUTPATIENT_CLINIC_OR_DEPARTMENT_OTHER)
Admission: RE | Admit: 2016-08-30 | Discharge: 2016-08-30 | Disposition: A | Discharge status: Home / Self Care | Payer: BLUE CROSS/BLUE SHIELD | Source: Ambulatory Visit | Attending: Family Medicine | Admitting: Family Medicine

## 2016-08-30 DIAGNOSIS — G8929 Other chronic pain: Secondary | ICD-10-CM | POA: Diagnosis not present

## 2016-08-30 DIAGNOSIS — R200 Anesthesia of skin: Secondary | ICD-10-CM | POA: Diagnosis not present

## 2016-08-30 DIAGNOSIS — M4807 Spinal stenosis, lumbosacral region: Secondary | ICD-10-CM

## 2016-08-30 DIAGNOSIS — M545 Low back pain: Secondary | ICD-10-CM | POA: Diagnosis not present

## 2016-08-30 MED ORDER — GADOBENATE DIMEGLUMINE 529 MG/ML IV SOLN
15.0000 mL | Freq: Once | INTRAVENOUS | Status: AC | PRN
  Administered 2016-08-30: 15 mL via INTRAVENOUS

## 2016-09-03 ENCOUNTER — Ambulatory Visit (INDEPENDENT_AMBULATORY_CARE_PROVIDER_SITE_OTHER): Payer: BLUE CROSS/BLUE SHIELD | Admitting: Family Medicine

## 2016-09-03 ENCOUNTER — Encounter: Payer: Self-pay | Admitting: Family Medicine

## 2016-09-03 VITALS — BP 114/80 | HR 75 | Temp 98.6°F | Ht 72.0 in | Wt 188.1 lb

## 2016-09-03 DIAGNOSIS — M461 Sacroiliitis, not elsewhere classified: Secondary | ICD-10-CM

## 2016-09-03 DIAGNOSIS — F410 Panic disorder [episodic paroxysmal anxiety] without agoraphobia: Secondary | ICD-10-CM | POA: Diagnosis not present

## 2016-09-03 MED ORDER — METHYLPREDNISOLONE ACETATE 40 MG/ML IJ SUSP
80.0000 mg | Freq: Once | INTRAMUSCULAR | Status: AC
  Administered 2016-09-03: 80 mg via INTRAMUSCULAR

## 2016-09-03 MED ORDER — TRAMADOL HCL 50 MG PO TABS: 100.0000 mg | ORAL_TABLET | Freq: Two times a day (BID) | ORAL | 2 refills | Status: DC | PRN

## 2016-09-03 MED ORDER — HYDROXYZINE HCL 50 MG PO TABS: 50.0000 mg | ORAL_TABLET | Freq: Three times a day (TID) | ORAL | 0 refills | Status: DC | PRN

## 2016-09-03 NOTE — Progress Notes (Signed)
Chief Complaint  Patient presents with  . Results    MRI    Subjective: Patient is a 41 y.o. female here for f/u LBP.  She recently had an MRI that was normal. She is frustrated with the lack of answers and the steps her current pain provider is making her go through. The pain is worse over the tailbone on the left side. She is requesting an injection. Physical therapy is set up, however she has not have ample time to do this since her last visit. She tries to stretch at home, however she is sore for the next 3 days. Tramadol has been helpful, however and 100 mg doses twice a day. She is requesting to see another pain management provider. She is also requesting something for her nerves, specifically Klonopin.  ROS: MSK: +LBP/SI jt pain Neuro:+decreased sensation of foot  Family History  Problem Relation Age of Onset  . Alzheimer's disease Mother        Epo4+  . Microcephaly Father   . Heart attack Father   . Cancer Father        Prostate  . Stroke Father   . Diabetes Father   . Mental illness Father    Past Medical History:  Diagnosis Date  . Allergy   . Depression   . Eating disorder   . Fibromyalgia   . Hay fever   . History of chicken pox   . History of fainting spells of unknown cause   . Migraine   . Osteoarthritis   . Osteopenia   . Sacroiliitis (HCC)   . Scheurmann's disease    Allergies  Allergen Reactions  . Sulfa Antibiotics Anaphylaxis       . Vancomycin Anaphylaxis  . Prednisone     PTSD  . Reglan [Metoclopramide]     "it makes me feel like im crawling out of my skin"   . Terbutaline     "it makes me feel like im crawling out of my skin"     Current Outpatient Prescriptions:  .  Diclofenac Sodium (VOLTAREN PO), Take by mouth., Disp: , Rfl:  .  dicyclomine (BENTYL) 10 MG capsule, Take 1 capsule (10 mg total) by mouth 4 (four) times daily -  before meals and at bedtime., Disp: 120 capsule, Rfl: 2 .  Melatonin 5 MG TABS, Take 10 tablets by mouth  at bedtime., Disp: , Rfl:  .  meloxicam (MOBIC) 7.5 MG tablet, Take 1 tablet (7.5 mg total) by mouth 2 (two) times daily., Disp: 30 tablet, Rfl: 0 .  omeprazole (PRILOSEC) 40 MG capsule, Take 1 capsule (40 mg total) by mouth daily., Disp: 30 capsule, Rfl: 2 .  traMADol (ULTRAM) 50 MG tablet, Take 2 tablets (100 mg total) by mouth every 12 (twelve) hours as needed for moderate pain., Disp: 120 tablet, Rfl: 2 .  ergocalciferol (VITAMIN D2) 50000 units capsule, Take 50,000 Units by mouth once a week., Disp: , Rfl:  .  hydrOXYzine (ATARAX/VISTARIL) 50 MG tablet, Take 1 tablet (50 mg total) by mouth 3 (three) times daily as needed for anxiety., Disp: 90 tablet, Rfl: 0  Objective: BP 114/80 (BP Location: Left Arm, Patient Position: Sitting, Cuff Size: Normal)   Pulse 75   Temp 98.6 F (37 C) (Oral)   Ht 6' (1.829 m)   Wt 188 lb 2 oz (85.3 kg)   LMP 08/12/2016 (Exact Date)   SpO2 97%   BMI 25.51 kg/m  General: Awake, appears stated age Lungs: No accessory  muscle use MSK: +TTP over b/l SI jt, worse on L Psych: Age appropriate judgment and insight, normal affect and mood; tearful at times during exam  Procedure note; SI joint injection, b/l Verbal consent obtained. The PSIS's were palpated and demarcated with an otoscope speculum tip just medial to the side of interest. The area was cleaned with alcohol. Freeze spray was used.  The joint was entered and 40 mg Depomedrol with 2 mL of 1% lidocaine was injected. The area was then bandaged. The same was repeated on the opposite side. There were no complications noted.  The patient tolerated the procedure well.   Assessment and Plan: Sacroiliitis (HCC) - Plan: Ambulatory referral to Pain Clinic, traMADol (ULTRAM) 50 MG tablet  Panic attack - Plan: hydrOXYzine (ATARAX/VISTARIL) 50 MG tablet  Orders as above. OK to inc dose of Tramadol until she gets into pain. Cont NSAID. Try to stretch. PT ref in action. H1 blocker rather than benzo as  she is on partial Mu opioid agonist. She has psych resources.  Injections today immediately provided some relief. She will likely do well with future injections with pain.  The patient voiced understanding and agreement to the plan.  [name] [place], DO 09/03/16  12:10 PM

## 2016-09-03 NOTE — Patient Instructions (Signed)
If you do not hear anything about your referral in the next 1-2 weeks, call our office and ask for an update.  

## 2016-09-03 NOTE — Progress Notes (Signed)
Pre visit review using our clinic review tool, if applicable. No additional management support is needed unless otherwise documented below in the visit note. 

## 2016-09-03 NOTE — Addendum Note (Signed)
Addended by: Scharlene GlossEWING, ROBIN B on: 09/03/2016 04:17 PM   Modules accepted: Orders

## 2016-09-22 DIAGNOSIS — M9903 Segmental and somatic dysfunction of lumbar region: Secondary | ICD-10-CM | POA: Diagnosis not present

## 2016-09-22 DIAGNOSIS — M5416 Radiculopathy, lumbar region: Secondary | ICD-10-CM | POA: Diagnosis not present

## 2016-09-22 DIAGNOSIS — M9904 Segmental and somatic dysfunction of sacral region: Secondary | ICD-10-CM | POA: Diagnosis not present

## 2016-09-22 DIAGNOSIS — M9905 Segmental and somatic dysfunction of pelvic region: Secondary | ICD-10-CM | POA: Diagnosis not present

## 2016-09-24 DIAGNOSIS — M5416 Radiculopathy, lumbar region: Secondary | ICD-10-CM | POA: Diagnosis not present

## 2016-09-24 DIAGNOSIS — M9904 Segmental and somatic dysfunction of sacral region: Secondary | ICD-10-CM | POA: Diagnosis not present

## 2016-09-24 DIAGNOSIS — M9903 Segmental and somatic dysfunction of lumbar region: Secondary | ICD-10-CM | POA: Diagnosis not present

## 2016-09-24 DIAGNOSIS — M9905 Segmental and somatic dysfunction of pelvic region: Secondary | ICD-10-CM | POA: Diagnosis not present

## 2016-09-25 ENCOUNTER — Encounter: Payer: Self-pay | Admitting: Student in an Organized Health Care Education/Training Program

## 2016-09-25 ENCOUNTER — Ambulatory Visit
Payer: BLUE CROSS/BLUE SHIELD | Attending: Student in an Organized Health Care Education/Training Program | Admitting: Student in an Organized Health Care Education/Training Program

## 2016-09-25 VITALS — BP 127/77 | HR 64 | Temp 98.1°F | Resp 16 | Ht 76.0 in | Wt 185.0 lb

## 2016-09-25 DIAGNOSIS — F4310 Post-traumatic stress disorder, unspecified: Secondary | ICD-10-CM | POA: Diagnosis not present

## 2016-09-25 DIAGNOSIS — Z881 Allergy status to other antibiotic agents status: Secondary | ICD-10-CM | POA: Diagnosis not present

## 2016-09-25 DIAGNOSIS — M4698 Unspecified inflammatory spondylopathy, sacral and sacrococcygeal region: Secondary | ICD-10-CM | POA: Diagnosis not present

## 2016-09-25 DIAGNOSIS — M791 Myalgia: Secondary | ICD-10-CM | POA: Diagnosis not present

## 2016-09-25 DIAGNOSIS — M545 Low back pain: Secondary | ICD-10-CM | POA: Diagnosis not present

## 2016-09-25 DIAGNOSIS — G894 Chronic pain syndrome: Secondary | ICD-10-CM | POA: Insufficient documentation

## 2016-09-25 DIAGNOSIS — M7918 Myalgia, other site: Secondary | ICD-10-CM

## 2016-09-25 DIAGNOSIS — R531 Weakness: Secondary | ICD-10-CM | POA: Diagnosis not present

## 2016-09-25 DIAGNOSIS — M8580 Other specified disorders of bone density and structure, unspecified site: Secondary | ICD-10-CM | POA: Insufficient documentation

## 2016-09-25 DIAGNOSIS — M5124 Other intervertebral disc displacement, thoracic region: Secondary | ICD-10-CM | POA: Insufficient documentation

## 2016-09-25 DIAGNOSIS — M797 Fibromyalgia: Secondary | ICD-10-CM | POA: Insufficient documentation

## 2016-09-25 DIAGNOSIS — M1990 Unspecified osteoarthritis, unspecified site: Secondary | ICD-10-CM | POA: Insufficient documentation

## 2016-09-25 DIAGNOSIS — F339 Major depressive disorder, recurrent, unspecified: Secondary | ICD-10-CM | POA: Insufficient documentation

## 2016-09-25 DIAGNOSIS — M461 Sacroiliitis, not elsewhere classified: Secondary | ICD-10-CM | POA: Diagnosis not present

## 2016-09-25 DIAGNOSIS — F411 Generalized anxiety disorder: Secondary | ICD-10-CM | POA: Insufficient documentation

## 2016-09-25 DIAGNOSIS — G629 Polyneuropathy, unspecified: Secondary | ICD-10-CM | POA: Diagnosis not present

## 2016-09-25 DIAGNOSIS — M47818 Spondylosis without myelopathy or radiculopathy, sacral and sacrococcygeal region: Secondary | ICD-10-CM

## 2016-09-25 MED ORDER — GABAPENTIN 300 MG PO CAPS: 300.0000 mg | ORAL_CAPSULE | Freq: Three times a day (TID) | ORAL | 2 refills | Status: DC

## 2016-09-25 NOTE — Patient Instructions (Addendum)
1. UDS today 2. Bilateral SI joint injection  3. Pain Psych referral 4. Gabapentin 300 mg three times a day  Sacroiliac (SI) Joint Injection Patient Information  Description: The sacroiliac joint connects the scrum (very low back and tailbone) to the ilium (a pelvic bone which also forms half of the hip joint).  Normally this joint experiences very little motion.  When this joint becomes inflamed or unstable low back and or hip and pelvis pain may result.  Injection of this joint with local anesthetics (numbing medicines) and steroids can provide diagnostic information and reduce pain.  This injection is performed with the aid of x-ray guidance into the tailbone area while you are lying on your stomach.   You may experience an electrical sensation down the leg while this is being done.  You may also experience numbness.  We also may ask if we are reproducing your normal pain during the injection.  Conditions which may be treated SI injection:   Low back, buttock, hip or leg pain  Preparation for the Injection:  1. Do not eat any solid food or dairy products within 8 hours of your appointment.  2. You may drink clear liquids up to 3 hours before appointment.  Clear liquids include water, black coffee, juice or soda.  No milk or cream please. 3. You may take your regular medications, including pain medications with a sip of water before your appointment.  Diabetics should hold regular insulin (if take separately) and take 1/2 normal NPH dose the morning of the procedure.  Carry some sugar containing items with you to your appointment. 4. A driver must accompany you and be prepared to drive you home after your procedure. 5. Bring all of your current medications with you. 6. An IV may be inserted and sedation may be given at the discretion of the physician. 7. A blood pressure cuff, EKG and other monitors will often be applied during the procedure.  Some patients may need to have extra oxygen  administered for a short period.  8. You will be asked to provide medical information, including your allergies, prior to the procedure.  We must know immediately if you are taking blood thinners (like Coumadin/Warfarin) or if you are allergic to IV iodine contrast (dye).  We must know if you could possible be pregnant.  Possible side effects:   Bleeding from needle site  Infection (rare, may require surgery)  Nerve injury (rare)  Numbness & tingling (temporary)  A brief convulsion or seizure  Light-headedness (temporary)  Pain at injection site (several days)  Decreased blood pressure (temporary)  Weakness in the leg (temporary)   Call if you experience:   New onset weakness or numbness of an extremity below the injection site that last more than 8 hours.  Hives or difficulty breathing ( go to the emergency room)  Inflammation or drainage at the injection site  Any new symptoms which are concerning to you  Please note:  Although the local anesthetic injected can often make your back/ hip/ buttock/ leg feel good for several hours after the injections, the pain will likely return.  It takes 3-7 days for steroids to work in the sacroiliac area.  You may not notice any pain relief for at least that one week.  If effective, we will often do a series of three injections spaced 3-6 weeks apart to maximally decrease your pain.  After the initial series, we generally will wait some months before a repeat injection of the  same type.  If you have any questions, please call [phone] [facility]

## 2016-09-25 NOTE — Progress Notes (Addendum)
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  Lumbar Spine Area Exam  Skin & Axial Inspection: No masses, redness, or swelling Alignment: Symmetrical Functional ROM: Unrestricted ROM      Stability: No instability detected Muscle Tone/Strength: Functionally intact. No obvious neuro-muscular anomalies detected. Sensory (Neurological): Unimpaired Palpation: No palpable anomalies       Provocative Tests: Lumbar Hyperextension and rotation test: Positive bilaterally for facet joint pain. Left greater than right Lumbar Lateral bending test: Positive ipsilateral radicular pain, on the left. Positive for left-sided foraminal stenosis. Patrick's Maneuver: Positive for bilateral S-I arthralgia Left greater than right.        Gait & Posture Assessment  Ambulation:  Patient ambulates using a cane Gait: Antalgic Posture: WNL   Lower Extremity Exam    Side: Right lower extremity  Side: Left lower extremity  Skin & Extremity Inspection:  Skin color, temperature, and hair growth are WNL. No peripheral edema or cyanosis. No masses, redness, swelling, asymmetry, or associated skin lesions. No contractures.  Skin & Extremity Inspection: Skin color, temperature, and hair growth are WNL. No peripheral edema or cyanosis. No masses, redness, swelling, asymmetry, or associated skin lesions. No contractures.  Functional ROM: Unrestricted ROM          Functional ROM: Unrestricted ROM          Muscle Tone/Strength: Functionally intact. No obvious neuro-muscular anomalies detected.  Muscle Tone/Strength: Functionally intact. No obvious neuro-muscular anomalies detected.  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 Diagnoses of Myofascial pain, Chronic pain syndrome, and Recurrent major depressive disorder, remission status unspecified ([place]) were also pertinent to this visit.  Visit Diagnosis (New problems to examiner): 1. SI joint arthritis ([place])   2. Myofascial pain   3. Chronic pain syndrome   4. Recurrent major depressive disorder, remission status unspecified ([place])    Plan of Care (Initial workup plan)  Note: Please be advised that as per protocol, today's visit has been an evaluation only. We have not taken over the patient's controlled substance management.  41 year old female with a history of axial low back pain left greater than right, left hip and SI joint pain that radiates into her left leg. 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 Patient does have a positive Patrick's on the left suggesting SI joint dysfunction. She states that she obtain significant pain relief for a couple of days after having a trigger point  injection over lying her SI joint perhaps suggesting a myofascial pathology of her pain. Patient's lumbar spine MRI is unremarkable. Patient's thoracic MRI from 2005 shows very minor right posterior lateral disc protrusion at T7-T8 level at the T10-T11 level without nerve root compression. I do not believe this to be causing the majority of her back pain. Furthermore the patient's x-rays of her sacrum and coccyx were largely unremarkable although the patient does have pain with palpation overlying her inferior portion of PSIS along with positive Patrick's on left greater than right.  Findings extensive discussion with the patient about how she would not be a candidate for opioid therapy at our clinic given her age, somatic complaints which are not necessarily reinforced or corroborated on imaging, history of major depression and PTSD, concomitant benzodiazepine use.  When I did have a discussion about interventional procedures in other ways I could help her such as referral to psychotherapy, referral to aquatic therapy and physical therapy, non-opioid analgesics, patient started looking at her significant other and all away. He asked her at the time "so you're not going to get anything today". I emphasized to the patient that pain management extends beyond opioid therapy and how I could help her in other ways.  Patient also endorses that her previous urine drug screens were positive for THC only because she was using CBD oil.   I also counseled the patient on various pain coping strategies such as deep breathing, mindfulness, meditation. I also discussed with her the importance of physical therapy and active  exercise which the patient states she is interested in.  I told the patient that her treatment options for right now could include starting gabapentin 300 mg 3 times a day and also scheduling her for bilateral sacroiliac joint injections given her physical exam findings of positive Patrick's  bilaterally, left greater than right.  I also told the patient that I could refer her to a pain psychologist to help with pain coping as well.  After we had discussed the plan, the patient was in agreement and I told her that I would go place orders. Upon returning to her room with the nursing staff, both the patient and her significant other had left. Upon chart review she has done this in the past especially when she presented to the emergency department and was unable to obtain any opioid medications.   I spent over 45 minutes with the patient discussing how I could help manage her pain through interventional and non-opioid means. She seemed to be interested only realizing that she had left the clinic prior to the nursing staff reviewing the after visit summary with her. These actions suggest drug-seeking behavior and this will be discussed if and when the patient follows up for SI joint injections. She will need to complete urine drug screen at that time too since she failed to do that today.  Plan: -The patient returns for appointment, however complete urine drug screen and inform your clinic policy that she must wait for nurse to check her out. -Schedule for bilateral SI joint injections.  -Referral to pain psychology regarding risk of substance abuse disorder. -Prescription for gabapentin 300 mg 3 times a day -Patient can continue all other medications as prescribed including melatonin 5 mg,Voltaren gel.  Ordered Lab-work, Procedure(s), Referral(s), & Consult(s): Orders Placed This Encounter  Procedures  . SACROILIAC JOINT INJECTINS  . Compliance Drug Analysis, Ur  . Ambulatory referral to Psychology   Pharmacotherapy (current): Medications ordered:  Meds ordered this encounter  Medications  . gabapentin (NEURONTIN) 300 MG capsule    Sig: Take 1 capsule (300 mg total) by mouth 3 (three) times daily.    Dispense:  90 capsule    Refill:  2   Medications administered during this  visit: Gail Watkins had no medications administered during this visit.    Provider-requested follow-up: No Follow-up on file.  Future Appointments Date Time Provider [place]  10/06/2016 10:30 AM Marcial Pacas, MD [facility] None    Primary Care Physician: Shelda Pal, DO Location: [facility] Note by: Gillis Santa, M.D, Date: 09/25/2016; Time: 10:57 AM  Patient Instructions  1. UDS today 2. Bilateral SI joint injection  3. Pain Psych referral 4. Gabapentin 300 mg three times a day  Sacroiliac (SI) Joint Injection Patient Information  Description: The sacroiliac joint connects the scrum (very low back and tailbone) to the ilium (a pelvic bone which also forms half of the hip joint).  Normally this joint experiences very little motion.  When this joint becomes inflamed or unstable low back and or hip and pelvis pain may result.  Injection of this joint with local anesthetics (numbing medicines) and steroids can provide diagnostic information and reduce pain.  This injection is performed with the aid of x-ray guidance into the tailbone area while you are lying on your stomach.   You may experience an electrical sensation down the leg while this is being done.  You may also experience numbness.  We also may ask if we are  reproducing your normal pain during the injection.  Conditions which may be treated SI injection:   Low back, buttock, hip or leg pain  Preparation for the Injection:  1. Do not eat any solid food or dairy products within 8 hours of your appointment.  2. You may drink clear liquids up to 3 hours before appointment.  Clear liquids include water, black coffee, juice or soda.  No milk or cream please. 3. You may take your regular medications, including pain medications with a sip of water before your appointment.  Diabetics should hold regular insulin (if take separately) and take 1/2 normal NPH dose the morning of the procedure.   Carry some sugar containing items with you to your appointment. 4. A driver must accompany you and be prepared to drive you home after your procedure. 5. Bring all of your current medications with you. 6. An IV may be inserted and sedation may be given at the discretion of the physician. 7. A blood pressure cuff, EKG and other monitors will often be applied during the procedure.  Some patients may need to have extra oxygen administered for a short period.  8. You will be asked to provide medical information, including your allergies, prior to the procedure.  We must know immediately if you are taking blood thinners (like Coumadin/Warfarin) or if you are allergic to IV iodine contrast (dye).  We must know if you could possible be pregnant.  Possible side effects:   Bleeding from needle site  Infection (rare, may require surgery)  Nerve injury (rare)  Numbness & tingling (temporary)  A brief convulsion or seizure  Light-headedness (temporary)  Pain at injection site (several days)  Decreased blood pressure (temporary)  Weakness in the leg (temporary)   Call if you experience:   New onset weakness or numbness of an extremity below the injection site that last more than 8 hours.  Hives or difficulty breathing ( go to the emergency room)  Inflammation or drainage at the injection site  Any new symptoms which are concerning to you  Please note:  Although the local anesthetic injected can often make your back/ hip/ buttock/ leg feel good for several hours after the injections, the pain will likely return.  It takes 3-7 days for steroids to work in the sacroiliac area.  You may not notice any pain relief for at least that one week.  If effective, we will often do a series of three injections spaced 3-6 weeks apart to maximally decrease your pain.  After the initial series, we generally will wait some months before a repeat injection of the same type.  If you have any questions,  please call [phone] [place]

## 2016-09-25 NOTE — Progress Notes (Signed)
Safety precautions to be maintained throughout the outpatient stay will include: orient to surroundings, keep bed in low position, maintain call bell within reach at all times, provide assistance with transfer out of bed and ambulation.   Patient left before uds or instructions given.

## 2016-09-29 ENCOUNTER — Ambulatory Visit
Admission: RE | Admit: 2016-09-29 | Discharge: 2016-09-29 | Disposition: A | Discharge status: Home / Self Care | Payer: BLUE CROSS/BLUE SHIELD | Source: Ambulatory Visit | Attending: Student in an Organized Health Care Education/Training Program | Admitting: Student in an Organized Health Care Education/Training Program

## 2016-09-29 ENCOUNTER — Ambulatory Visit (HOSPITAL_BASED_OUTPATIENT_CLINIC_OR_DEPARTMENT_OTHER): Payer: BLUE CROSS/BLUE SHIELD | Admitting: Student in an Organized Health Care Education/Training Program

## 2016-09-29 ENCOUNTER — Encounter: Payer: Self-pay | Admitting: Student in an Organized Health Care Education/Training Program

## 2016-09-29 VITALS — BP 130/93 | HR 64 | Temp 98.1°F | Resp 16 | Ht 72.0 in | Wt 185.0 lb

## 2016-09-29 DIAGNOSIS — M79605 Pain in left leg: Secondary | ICD-10-CM | POA: Diagnosis not present

## 2016-09-29 DIAGNOSIS — M47818 Spondylosis without myelopathy or radiculopathy, sacral and sacrococcygeal region: Secondary | ICD-10-CM | POA: Diagnosis not present

## 2016-09-29 DIAGNOSIS — M7918 Myalgia, other site: Secondary | ICD-10-CM

## 2016-09-29 DIAGNOSIS — M791 Myalgia: Secondary | ICD-10-CM | POA: Insufficient documentation

## 2016-09-29 DIAGNOSIS — M545 Low back pain: Secondary | ICD-10-CM | POA: Diagnosis not present

## 2016-09-29 DIAGNOSIS — M4698 Unspecified inflammatory spondylopathy, sacral and sacrococcygeal region: Secondary | ICD-10-CM

## 2016-09-29 DIAGNOSIS — G894 Chronic pain syndrome: Secondary | ICD-10-CM | POA: Insufficient documentation

## 2016-09-29 MED ORDER — DEXAMETHASONE SODIUM PHOSPHATE 10 MG/ML IJ SOLN
10.0000 mg | Freq: Once | INTRAMUSCULAR | Status: AC
  Administered 2016-09-29: 10 mg
  Filled 2016-09-29: qty 1

## 2016-09-29 MED ORDER — LIDOCAINE HCL (PF) 1 % IJ SOLN
10.0000 mL | Freq: Once | INTRAMUSCULAR | Status: AC
  Administered 2016-09-29: 5 mL
  Filled 2016-09-29: qty 10

## 2016-09-29 MED ORDER — ROPIVACAINE HCL 2 MG/ML IJ SOLN
10.0000 mL | Freq: Once | INTRAMUSCULAR | Status: AC
  Administered 2016-09-29: 10 mL
  Filled 2016-09-29: qty 10

## 2016-09-29 NOTE — Patient Instructions (Signed)

## 2016-09-29 NOTE — Progress Notes (Signed)
Patient's Name: Gail Watkins  MRN: [id number]  Referring Provider: Sharlene Dory*  DOB: April 19, 1975  PCP: Sharlene Dory, DO  DOS: 09/29/2016  Note by: Edward Jolly, MD  Service setting: Ambulatory outpatient  Specialty: Interventional Pain Management  Patient type: Established  Location: [facility] (AMB) Pain Management Facility  Visit type: Interventional Procedure   Primary Reason for Visit: Interventional Pain Management Treatment. CC: Back Pain (low) and Leg Pain (left)  Procedure:  Anesthesia, Analgesia, Anxiolysis:  Type: Diagnostic Sacroiliac Joint Steroid Injection Region: Superior Lumbosacral Region Level: PSIS (Posterior Superior Iliac Spine) Laterality: Bilateral  Type: Local Anesthesia Local Anesthetic: Lidocaine 1% Route: Infiltration ([place]/IM) IV Access: Secured Sedation: Meaningful verbal contact was maintained at all times during the procedure  Indication(s): Analgesia and Anxiety  Indications: 1. SI joint arthritis ([facility])   2. Myofascial pain   3. Chronic pain syndrome    Pain Score: Pre-procedure: 7 /10 Post-procedure: 3 /10  Pre-op Assessment:  Gail Watkins is a 41 y.o. (year old), female patient, seen today for interventional treatment. She  has a past surgical history that includes Cesarean section; cystectomy; Dilation and curettage of uterus; and Ovarian cyst removal. Gail Watkins has a current medication list which includes the following prescription(s): diclofenac sodium, dicyclomine, ergocalciferol, gabapentin, hydroxyzine, melatonin, meloxicam, omeprazole, and tramadol. Her primarily concern today is the Back Pain (low) and Leg Pain (left)  41 year old female with a history of hip, groin, buttock pain secondary to SI joint arthritis (positive Patrick's bilaterally) here for bilateral SI joint injection.  Initial Vital Signs: Last menstrual period 09/15/2016. BMI: Estimated body mass index is 25.09 kg/m as calculated from the following:   Height as  of this encounter: 6' (1.829 m).   Weight as of this encounter: 185 lb (83.9 kg).  Risk Assessment: Allergies: Reviewed. She is allergic to sulfa antibiotics; vancomycin; prednisone; reglan [metoclopramide]; and terbutaline.  Allergy Precautions: None required Coagulopathies: Reviewed. None identified.  Blood-thinner therapy: None at this time Active Infection(s): Reviewed. None identified. Gail Watkins is afebrile  Site Confirmation: Gail Watkins was asked to confirm the procedure and laterality before marking the site Procedure checklist: Completed Consent: Before the procedure and under the influence of no sedative(s), amnesic(s), or anxiolytics, the patient was informed of the treatment options, risks and possible complications. To fulfill our ethical and legal obligations, as recommended by the American Medical Association's Code of Ethics, I have informed the patient of my clinical impression; the nature and purpose of the treatment or procedure; the risks, benefits, and possible complications of the intervention; the alternatives, including doing nothing; the risk(s) and benefit(s) of the alternative treatment(s) or procedure(s); and the risk(s) and benefit(s) of doing nothing. The patient was provided information about the general risks and possible complications associated with the procedure. These may include, but are not limited to: failure to achieve desired goals, infection, bleeding, organ or nerve damage, allergic reactions, paralysis, and death. In addition, the patient was informed of those risks and complications associated to the procedure, such as failure to decrease pain; infection; bleeding; organ or nerve damage with subsequent damage to sensory, motor, and/or autonomic systems, resulting in permanent pain, numbness, and/or weakness of one or several areas of the body; allergic reactions; (i.e.: anaphylactic reaction); and/or death. Furthermore, the patient was informed of those  risks and complications associated with the medications. These include, but are not limited to: allergic reactions (i.e.: anaphylactic or anaphylactoid reaction(s)); adrenal axis suppression; blood sugar elevation that in diabetics may result in ketoacidosis  or comma; water retention that in patients with history of congestive heart failure may result in shortness of breath, pulmonary edema, and decompensation with resultant heart failure; weight gain; swelling or edema; medication-induced neural toxicity; particulate matter embolism and blood vessel occlusion with resultant organ, and/or nervous system infarction; and/or aseptic necrosis of one or more joints. Finally, the patient was informed that Medicine is not an exact science; therefore, there is also the possibility of unforeseen or unpredictable risks and/or possible complications that may result in a catastrophic outcome. The patient indicated having understood very clearly. We have given the patient no guarantees and we have made no promises. Enough time was given to the patient to ask questions, all of which were answered to the patient's satisfaction. Gail Watkins has indicated that she wanted to continue with the procedure. Attestation: I, the ordering provider, attest that I have discussed with the patient the benefits, risks, side-effects, alternatives, likelihood of achieving goals, and potential problems during recovery for the procedure that I have provided informed consent. Date: 09/29/2016; Time: 9:18 AM  Pre-Procedure Preparation:  Monitoring: As per clinic protocol. Respiration, ETCO2, SpO2, BP, heart rate and rhythm monitor placed and checked for adequate function Safety Precautions: Patient was assessed for positional comfort and pressure points before starting the procedure. Time-out: I initiated and conducted the "Time-out" before starting the procedure, as per protocol. The patient was asked to participate by confirming the accuracy  of the "Time Out" information. Verification of the correct person, site, and procedure were performed and confirmed by me, the nursing staff, and the patient. "Time-out" conducted as per Joint Commission's Universal Protocol (UP.01.01.01). "Time-out" Date & Time: 09/29/2016; 1004 hrs.  Description of Procedure Process:  Position: Prone Target Area: Superior, posterior, aspect of the sacroiliac fissure Approach: Posterior, paraspinal, ipsilateral approach. Area Prepped: Entire Lower Lumbosacral Region Prepping solution: ChloraPrep (2% chlorhexidine gluconate and 70% isopropyl alcohol) Safety Precautions: Aspiration looking for blood return was conducted prior to all injections. At no point did we inject any substances, as a needle was being advanced. No attempts were made at seeking any paresthesias. Safe injection practices and needle disposal techniques used. Medications properly checked for expiration dates. SDV (single dose vial) medications used. Description of the Procedure: Protocol guidelines were followed. The patient was placed in position over the procedure table. The target area was identified and the area prepped in the usual manner. Skin & deeper tissues infiltrated with local anesthetic. Appropriate amount of time allowed to pass for local anesthetics to take effect. The procedure needle was advanced under fluoroscopic guidance into the sacroiliac joint until a firm endpoint was obtained. Proper needle placement secured. Negative aspiration confirmed. Solution injected in intermittent fashion, asking for systemic symptoms every 0.5cc of injectate. The needles were then removed and the area cleansed, making sure to leave some of the prepping solution back to take advantage of its long term bactericidal properties. Vitals:   09/29/16 0924 09/29/16 1008 09/29/16 1013 09/29/16 1018  BP: 122/81 108/77 107/82 (!) 130/93  Pulse: 65 (!) 59 61 64  Resp: Temp: 98.1 F (36.7 C)      TempSrc: Oral     SpO2: 100% 100% 99% 99%  Weight: 185 lb (83.9 kg)     Height: 6' (1.829 m)       Start Time: 1006 hrs. End Time: 1014 hrs. Materials:  Needle(s) Type: Regular needle Gauge: 22G Length: 3.5-in Medication(s): We administered lidocaine (PF), ropivacaine (PF) 2 mg/mL (0.2%), and  dexamethasone. Please see chart orders for dosing details. 2.5 cc intra-articular, 2.5 cc periarticular on each side. Solution made of 9 cc of 0.2% ropivacaine and 1 cc of Decadron. Imaging Guidance (Non-Spinal):  Type of Imaging Technique: Fluoroscopy Guidance (Non-Spinal) Indication(s): Assistance in needle guidance and placement for procedures requiring needle placement in or near specific anatomical locations not easily accessible without such assistance. Exposure Time: Please see nurses notes. Contrast: None used. Fluoroscopic Guidance: I was personally present during the use of fluoroscopy. "Tunnel Vision Technique" used to obtain the best possible view of the target area. Parallax error corrected before commencing the procedure. "Direction-depth-direction" technique used to introduce the needle under continuous pulsed fluoroscopy. Once target was reached, antero-posterior, oblique, and lateral fluoroscopic projection used confirm needle placement in all planes. Images permanently stored in EMR. Interpretation: I personally interpreted the imaging intraoperatively. Adequate needle placement confirmed in multiple planes. Appropriate spread of contrast into desired area was observed. No evidence of afferent or efferent intravascular uptake. Permanent images saved into the patient's record.  Antibiotic Prophylaxis:  Indication(s): None identified Antibiotic given: None  Post-operative Assessment:  EBL: None Complications: No immediate post-treatment complications observed by team, or reported by patient. Note: The patient tolerated the entire procedure well. A repeat set of vitals were taken  after the procedure and the patient was kept under observation following institutional policy, for this type of procedure. Post-procedural neurological assessment was performed, showing return to baseline, prior to discharge. The patient was provided with post-procedure discharge instructions, including a section on how to identify potential problems. Should any problems arise concerning this procedure, the patient was given instructions to immediately contact us, at any time, without hesitation. In any case, we plan to contact the patient by telephone for a follow-up status report regarding this interventional procedure. Comments:  No additional relevant information.  Plan of Care  Disposition: Discharge home  Discharge Date & Time: 09/29/2016; 1020 hrs.  Lower extremity strength unchanged from preprocedure, 5 out of 5 strength in knee flexion, knee extension, plantar flexion, dorsiflexion.  Follow-up in 2 weeks. Physician-requested Follow-up:  Return in about 2 weeks (around 10/13/2016).  Future Appointments Date Time Provider Department Center  10/06/2016 10:30 AM Levert Feinstein, MD [facility] None  10/14/2016 10:15 AM Edward Jolly, MD [facility] None    Imaging Orders     DG C-Arm 1-60 Min-No Report     DG C-Arm 1-60 Min-No Report Procedure Orders    No procedure(s) ordered today    Medications ordered for procedure: Meds ordered this encounter  Medications  . lidocaine (PF) (XYLOCAINE) 1 % injection 10 mL  . ropivacaine (PF) 2 mg/mL (0.2%) (NAROPIN) injection 10 mL  . dexamethasone (DECADRON) injection 10 mg   Medications administered: We administered lidocaine (PF), ropivacaine (PF) 2 mg/mL (0.2%), and dexamethasone.  See the medical record for exact dosing, route, and time of administration.  New Prescriptions   No medications on file   Primary Care Physician: Sharlene Dory, DO Location: [facility] Facility Note by: Edward Jolly, MD Date:  09/29/2016; Time: 10:26 AM  Disclaimer:  Medicine is not an exact science. The only guarantee in medicine is that nothing is guaranteed. It is important to note that the decision to proceed with this intervention was based on the information collected from the patient. The Data and conclusions were drawn from the patient's questionnaire, the interview, and the physical examination. Because the information was provided in large part by the patient, it cannot be guaranteed  that it has not been purposely or unconsciously manipulated. Every effort has been made to obtain as much relevant data as possible for this evaluation. It is important to note that the conclusions that lead to this procedure are derived in large part from the available data. Always take into account that the treatment will also be dependent on availability of resources and existing treatment guidelines, considered by other Pain Management Practitioners as being common knowledge and practice, at the time of the intervention. For Medico-Legal purposes, it is also important to point out that variation in procedural techniques and pharmacological choices are the acceptable norm. The indications, contraindications, technique, and results of the above procedure should only be interpreted and judged by a Board-Certified Interventional Pain Specialist with extensive familiarity and expertise in the same exact procedure and technique.

## 2016-09-29 NOTE — Addendum Note (Signed)
Addended by: Rickey Barbara on: 09/29/2016 10:59 AM   Modules accepted: Orders

## 2016-09-29 NOTE — Progress Notes (Signed)
Safety precautions to be maintained throughout the outpatient stay will include: orient to surroundings, keep bed in low position, maintain call bell within reach at all times, provide assistance with transfer out of bed and ambulation.  

## 2016-09-30 ENCOUNTER — Telehealth: Payer: Self-pay | Admitting: *Deleted

## 2016-09-30 NOTE — Telephone Encounter (Signed)
Attempted to call patient for post procedure follow-up. Voicemail was that of someone who isnt the patient.

## 2016-10-03 LAB — TOXASSURE SELECT 13 (MW), URINE

## 2016-10-06 ENCOUNTER — Ambulatory Visit: Payer: BLUE CROSS/BLUE SHIELD | Admitting: Neurology

## 2016-10-06 DIAGNOSIS — M5416 Radiculopathy, lumbar region: Secondary | ICD-10-CM | POA: Diagnosis not present

## 2016-10-06 DIAGNOSIS — M9903 Segmental and somatic dysfunction of lumbar region: Secondary | ICD-10-CM | POA: Diagnosis not present

## 2016-10-06 DIAGNOSIS — M9904 Segmental and somatic dysfunction of sacral region: Secondary | ICD-10-CM | POA: Diagnosis not present

## 2016-10-06 DIAGNOSIS — M9905 Segmental and somatic dysfunction of pelvic region: Secondary | ICD-10-CM | POA: Diagnosis not present

## 2016-10-07 ENCOUNTER — Telehealth: Payer: Self-pay | Admitting: Family Medicine

## 2016-10-07 NOTE — Telephone Encounter (Signed)
Pt uses [facility] on [address] [place]. call in Omeprazole 40 mg once a day. Pt used to get it from prior PCP but now Carmelia Roller is her PCP.

## 2016-10-08 MED ORDER — OMEPRAZOLE 40 MG PO CPDR: 40.0000 mg | DELAYED_RELEASE_CAPSULE | Freq: Every day | ORAL | 2 refills | Status: DC

## 2016-10-08 NOTE — Telephone Encounter (Signed)
Refill done.  

## 2016-10-09 DIAGNOSIS — M9905 Segmental and somatic dysfunction of pelvic region: Secondary | ICD-10-CM | POA: Diagnosis not present

## 2016-10-09 DIAGNOSIS — M9904 Segmental and somatic dysfunction of sacral region: Secondary | ICD-10-CM | POA: Diagnosis not present

## 2016-10-09 DIAGNOSIS — M9903 Segmental and somatic dysfunction of lumbar region: Secondary | ICD-10-CM | POA: Diagnosis not present

## 2016-10-09 DIAGNOSIS — M5416 Radiculopathy, lumbar region: Secondary | ICD-10-CM | POA: Diagnosis not present

## 2016-10-13 DIAGNOSIS — M9905 Segmental and somatic dysfunction of pelvic region: Secondary | ICD-10-CM | POA: Diagnosis not present

## 2016-10-13 DIAGNOSIS — M9904 Segmental and somatic dysfunction of sacral region: Secondary | ICD-10-CM | POA: Diagnosis not present

## 2016-10-13 DIAGNOSIS — M9903 Segmental and somatic dysfunction of lumbar region: Secondary | ICD-10-CM | POA: Diagnosis not present

## 2016-10-13 DIAGNOSIS — M5416 Radiculopathy, lumbar region: Secondary | ICD-10-CM | POA: Diagnosis not present

## 2016-10-14 ENCOUNTER — Ambulatory Visit: Payer: BLUE CROSS/BLUE SHIELD | Admitting: Student in an Organized Health Care Education/Training Program

## 2016-10-15 DIAGNOSIS — M9903 Segmental and somatic dysfunction of lumbar region: Secondary | ICD-10-CM | POA: Diagnosis not present

## 2016-10-15 DIAGNOSIS — M5416 Radiculopathy, lumbar region: Secondary | ICD-10-CM | POA: Diagnosis not present

## 2016-10-15 DIAGNOSIS — M9905 Segmental and somatic dysfunction of pelvic region: Secondary | ICD-10-CM | POA: Diagnosis not present

## 2016-10-15 DIAGNOSIS — M9904 Segmental and somatic dysfunction of sacral region: Secondary | ICD-10-CM | POA: Diagnosis not present

## 2016-10-20 DIAGNOSIS — M9903 Segmental and somatic dysfunction of lumbar region: Secondary | ICD-10-CM | POA: Diagnosis not present

## 2016-10-20 DIAGNOSIS — M9905 Segmental and somatic dysfunction of pelvic region: Secondary | ICD-10-CM | POA: Diagnosis not present

## 2016-10-20 DIAGNOSIS — M9904 Segmental and somatic dysfunction of sacral region: Secondary | ICD-10-CM | POA: Diagnosis not present

## 2016-10-20 DIAGNOSIS — M5416 Radiculopathy, lumbar region: Secondary | ICD-10-CM | POA: Diagnosis not present

## 2016-10-21 ENCOUNTER — Encounter: Payer: Self-pay | Admitting: Student in an Organized Health Care Education/Training Program

## 2016-10-21 ENCOUNTER — Ambulatory Visit
Payer: BLUE CROSS/BLUE SHIELD | Attending: Student in an Organized Health Care Education/Training Program | Admitting: Student in an Organized Health Care Education/Training Program

## 2016-10-21 VITALS — BP 122/84 | HR 88 | Temp 97.2°F | Resp 18 | Ht 72.0 in | Wt 190.0 lb

## 2016-10-21 DIAGNOSIS — Z823 Family history of stroke: Secondary | ICD-10-CM | POA: Diagnosis not present

## 2016-10-21 DIAGNOSIS — F339 Major depressive disorder, recurrent, unspecified: Secondary | ICD-10-CM

## 2016-10-21 DIAGNOSIS — Z9889 Other specified postprocedural states: Secondary | ICD-10-CM | POA: Diagnosis not present

## 2016-10-21 DIAGNOSIS — Z888 Allergy status to other drugs, medicaments and biological substances status: Secondary | ICD-10-CM | POA: Insufficient documentation

## 2016-10-21 DIAGNOSIS — M8580 Other specified disorders of bone density and structure, unspecified site: Secondary | ICD-10-CM | POA: Diagnosis not present

## 2016-10-21 DIAGNOSIS — F17200 Nicotine dependence, unspecified, uncomplicated: Secondary | ICD-10-CM | POA: Insufficient documentation

## 2016-10-21 DIAGNOSIS — G894 Chronic pain syndrome: Secondary | ICD-10-CM | POA: Diagnosis not present

## 2016-10-21 DIAGNOSIS — M7918 Myalgia, other site: Secondary | ICD-10-CM | POA: Diagnosis not present

## 2016-10-21 DIAGNOSIS — Z881 Allergy status to other antibiotic agents status: Secondary | ICD-10-CM | POA: Diagnosis not present

## 2016-10-21 DIAGNOSIS — Z833 Family history of diabetes mellitus: Secondary | ICD-10-CM | POA: Insufficient documentation

## 2016-10-21 DIAGNOSIS — Z818 Family history of other mental and behavioral disorders: Secondary | ICD-10-CM | POA: Insufficient documentation

## 2016-10-21 DIAGNOSIS — Z8619 Personal history of other infectious and parasitic diseases: Secondary | ICD-10-CM | POA: Insufficient documentation

## 2016-10-21 DIAGNOSIS — M4698 Unspecified inflammatory spondylopathy, sacral and sacrococcygeal region: Secondary | ICD-10-CM | POA: Insufficient documentation

## 2016-10-21 DIAGNOSIS — M797 Fibromyalgia: Secondary | ICD-10-CM | POA: Insufficient documentation

## 2016-10-21 DIAGNOSIS — Z820 Family history of epilepsy and other diseases of the nervous system: Secondary | ICD-10-CM | POA: Insufficient documentation

## 2016-10-21 DIAGNOSIS — Z8249 Family history of ischemic heart disease and other diseases of the circulatory system: Secondary | ICD-10-CM | POA: Insufficient documentation

## 2016-10-21 DIAGNOSIS — Z809 Family history of malignant neoplasm, unspecified: Secondary | ICD-10-CM | POA: Diagnosis not present

## 2016-10-21 DIAGNOSIS — M461 Sacroiliitis, not elsewhere classified: Secondary | ICD-10-CM

## 2016-10-21 DIAGNOSIS — Z882 Allergy status to sulfonamides status: Secondary | ICD-10-CM | POA: Insufficient documentation

## 2016-10-21 DIAGNOSIS — M47818 Spondylosis without myelopathy or radiculopathy, sacral and sacrococcygeal region: Secondary | ICD-10-CM

## 2016-10-21 NOTE — Progress Notes (Signed)
Patient's Name: Gail Watkins  MRN: [id number]  Referring Provider: Shelda Pal*  DOB: 10/10/1975  PCP: Shelda Pal, DO  DOS: 10/21/2016  Note by: Gillis Santa, MD  Service setting: Ambulatory outpatient  Specialty: Interventional Pain Management  Location: [facility] (AMB) Pain Management Facility    Patient type: Established   Primary Reason(s) for Visit: Encounter for post-procedure evaluation of chronic illness with mild to moderate exacerbation CC: Back Pain (left buttock)  HPI  Gail Watkins is a 41 y.o. year old, female patient, who comes today for a post-procedure evaluation. She has Sacroiliitis (HCC) and Panic attack on her problem list. Her primarily concern today is the Back Pain (left buttock)  Pain Assessment: Location: Left Buttocks Radiating: denies Onset: More than a month ago Duration: Chronic pain Quality: Throbbing Severity: 7 /10 (self-reported pain score)  Note: Reported level is inconsistent with clinical observations. Clinically the patient looks like a 3/10 Information on the proper use of the pain scale provided to the patient today. When using our objective Pain Scale, levels between 6 and 10/10 are said to belong in an emergency room, as it progressively worsens from a 6/10, described as severely limiting, requiring emergency care not usually available at an outpatient pain management facility. At a 6/10 level, communication becomes difficult and requires great effort. Assistance to reach the emergency department may be required. Facial flushing and profuse sweating along with potentially dangerous increases in heart rate and blood pressure will be evident. Effect on ADL:  Limited ambulation Timing: Constant Modifying factors:    [name] comes in today for post-procedure evaluation after the treatment done on 09/29/2016.  Further details on both, my assessment(s), as well as the proposed treatment plan, please see below.  Post-Procedure  Assessment  09/29/2016 Procedure: bilateral sacroiliac joint injection Pre-procedure pain score:  7/10 Post-procedure pain score: 3/10         Influential Factors: BMI: 25.77 kg/m Intra-procedural challenges: None observed.         Assessment challenges: None detected.              Reported side-effects: None.        Post-procedural adverse reactions or complications: None reported         Sedation: Please see nurses note. When no sedatives are used, the analgesic levels obtained are directly associated to the effectiveness of the local anesthetics. However, when sedation is provided, the level of analgesia obtained during the initial 1 hour following the intervention, is believed to be the result of a combination of factors. These factors may include, but are not limited to: 1. The effectiveness of the local anesthetics used. 2. The effects of the analgesic(s) and/or anxiolytic(s) used. 3. The degree of discomfort experienced by the patient at the time of the procedure. 4. The patients ability and reliability in recalling and recording the events. 5. The presence and influence of possible secondary gains and/or psychosocial factors. Reported result: Relief experienced during the 1st hour after the procedure: 100 % (Ultra-Short Term Relief)            Interpretative annotation: Clinically appropriate result. Analgesia during this period is likely to be Local Anesthetic and/or IV Sedative (Analgesic/Anxiolytic) related.          Effects of local anesthetic: The analgesic effects attained during this period are directly associated to the localized infiltration of local anesthetics and therefore cary significant diagnostic value as to the etiological location, or anatomical origin, of the pain. Expected duration  of relief is directly dependent on the pharmacodynamics of the local anesthetic used. Long-acting (4-6 hours) anesthetics used.  Reported result: Relief during the next 4 to 6 hour after  the procedure: 0 % (Short-Term Relief)            Interpretative annotation: Clinically appropriate result. Analgesia during this period is likely to be Local Anesthetic-related.          Long-term benefit: Defined as the period of time past the expected duration of local anesthetics (1 hour for short-acting and 4-6 hours for long-acting). With the possible exception of prolonged sympathetic blockade from the local anesthetics, benefits during this period are typically attributed to, or associated with, other factors such as analgesic sensory neuropraxia, antiinflammatory effects, or beneficial biochemical changes provided by agents other than the local anesthetics.  Reported result: Extended relief following procedure: 75 % (ongoing.  Does not hurt while sleeping) (Long-Term Relief)            Interpretative annotation: Clinically appropriate result. Good relief. Therapeutic success. Inflammation plays a part in the etiology to the pain.          Current benefits: Defined as reported results that persistent at this point in time.   Analgesia: 50-75 %            Function: Ms. Noguchi reports improvement in function ROM: Somewhat improved Interpretative annotation: Good relief. Limited therapeutic benefit. Effective therapeutic approach.          Interpretation: Results would suggest a successful diagnostic and therapeutic intervention.                  Plan:  Please see "Plan of Care" for details.       Laboratory Chemistry  Inflammation Markers (CRP: Acute Phase) (ESR: Chronic Phase) No results found for: CRP, ESRSEDRATE               Renal Function Markers No results found for: BUN, CREATININE, GFRAA, GFRNONAA               Hepatic Function Markers No results found for: AST, ALT, ALBUMIN, ALKPHOS, HCVAB               Electrolytes No results found for: NA, K, CL, CALCIUM, MG               Neuropathy Markers No results found for: [id number]               Bone Pathology Markers No  results found for: Hendricks Milo, VD125OH2TOT, [id number], [id number], 25OHVITD1, 25OHVITD2, 25OHVITD3, CALCIUM, TESTOFREE, TESTOSTERONE               Coagulation Parameters Lab Results  Component Value Date   INR 1.0 12/29/2006   LABPROT 13.1 12/29/2006   APTT 30 12/29/2006   PLT 152 12/31/2006                 Cardiovascular Markers Lab Results  Component Value Date   HGB 8.9 DELTA CHECK NOTED (L) 12/31/2006   HCT 26.5 (L) 12/31/2006                 Note: Lab results reviewed.  Recent Diagnostic Imaging Results  DG C-Arm 1-60 Min-No Report Fluoroscopy was utilized by the requesting physician.  No radiographic  interpretation.  DG C-Arm 1-60 Min-No Report Fluoroscopy was utilized by the requesting physician.  No radiographic  interpretation.   Complexity Note: Imaging results reviewed. Results shared with Ms. Freddrick March, using Layman's terms.  Meds   Current Outpatient Prescriptions:  .  Diclofenac Sodium (VOLTAREN PO), Take by mouth., Disp: , Rfl:  .  dicyclomine (BENTYL) 10 MG capsule, Take 1 capsule (10 mg total) by mouth 4 (four) times daily -  before meals and at bedtime., Disp: 120 capsule, Rfl: 2 .  ergocalciferol (VITAMIN D2) 50000 units capsule, Take 50,000 Units by mouth once a week., Disp: , Rfl:  .  hydrOXYzine (ATARAX/VISTARIL) 50 MG tablet, Take 1 tablet (50 mg total) by mouth 3 (three) times daily as needed for anxiety., Disp: 90 tablet, Rfl: 0 .  Melatonin 5 MG TABS, Take 10 tablets by mouth at bedtime., Disp: , Rfl:  .  meloxicam (MOBIC) 7.5 MG tablet, Take 1 tablet (7.5 mg total) by mouth 2 (two) times daily., Disp: 30 tablet, Rfl: 0 .  NON FORMULARY, 1 tablet 3 (three) times daily. CBD pills, Disp: , Rfl:  .  omeprazole (PRILOSEC) 40 MG capsule, Take 1 capsule (40 mg total) by mouth daily., Disp: 30 capsule, Rfl: 2 .  traMADol (ULTRAM) 50 MG tablet, Take 2 tablets (100 mg total) by mouth every 12 (twelve) hours as needed for moderate  pain., Disp: 120 tablet, Rfl: 2 .  gabapentin (NEURONTIN) 300 MG capsule, Take 1 capsule (300 mg total) by mouth 3 (three) times daily. (Patient not taking: Reported on 10/21/2016), Disp: 90 capsule, Rfl: 2  ROS  Constitutional: Denies any fever or chills Gastrointestinal: No reported hemesis, hematochezia, vomiting, or acute GI distress Musculoskeletal: Denies any acute onset joint swelling, redness, loss of ROM, or weakness Neurological: No reported episodes of acute onset apraxia, aphasia, dysarthria, agnosia, amnesia, paralysis, loss of coordination, or loss of consciousness  Allergies  Ms. Rodenberg is allergic to sulfa antibiotics; vancomycin; prednisone; reglan [metoclopramide]; and terbutaline.  [place]  Drug: Ms. Kalisz  reports that she does not use drugs. Alcohol:  reports that she drinks alcohol. Tobacco:  reports that she has been smoking.  She has never used smokeless tobacco. Medical:  has a past medical history of Allergy; Depression; Eating disorder; Fibromyalgia; Hay fever; History of chicken pox; History of fainting spells of unknown cause; Migraine; Osteoarthritis; Osteopenia; Sacroiliitis ([place]); and Scheurmann's disease. Surgical: Ms. Leven  has a past surgical history that includes Cesarean section; cystectomy; Dilation and curettage of uterus; and Ovarian cyst removal. Family: family history includes Alzheimer's disease in her mother; Cancer in her father; Diabetes in her father; Heart attack in her father; Mental illness in her father; Microcephaly in her father; Stroke in her father.  Constitutional Exam  General appearance: Well nourished, well developed, and well hydrated. In no apparent acute distress Vitals:   10/21/16 1050  BP: 122/84  Pulse: 88  Resp: 18  Temp: (!) 97.2 F (36.2 C)  SpO2: 91%  Weight: 190 lb (86.2 kg)  Height: 6' (1.829 m)   BMI Assessment: Estimated body mass index is 25.77 kg/m as calculated from the following:   Height as of this  encounter: 6' (1.829 m).   Weight as of this encounter: 190 lb (86.2 kg).  BMI interpretation table: BMI level Category Range association with higher incidence of chronic pain  <18 kg/m2 Underweight   18.5-24.9 kg/m2 Ideal body weight   25-29.9 kg/m2 Overweight Increased incidence by 20%  30-34.9 kg/m2 Obese (Class I) Increased incidence by 68%  35-39.9 kg/m2 Severe obesity (Class II) Increased incidence by 136%  >40 kg/m2 Extreme obesity (Class III) Increased incidence by 254%   BMI Readings from Last 4 Encounters:  10/21/16 25.77 kg/m  09/29/16 25.09 kg/m  09/25/16 22.52 kg/m  09/03/16 25.51 kg/m   Wt Readings from Last 4 Encounters:  10/21/16 190 lb (86.2 kg)  09/29/16 185 lb (83.9 kg)  09/25/16 185 lb (83.9 kg)  09/03/16 188 lb 2 oz (85.3 kg)  Psych/Mental status: Alert, oriented x 3 (person, place, & time)       Eyes: PERLA Respiratory: No evidence of acute respiratory distress  Cervical Spine Area Exam  Skin & Axial Inspection: No masses, redness, edema, swelling, or associated skin lesions Alignment: Symmetrical Functional ROM: Unrestricted ROM      Stability: No instability detected Muscle Tone/Strength: Functionally intact. No obvious neuro-muscular anomalies detected. Sensory (Neurological): Unimpaired Palpation: No palpable anomalies              Upper Extremity (UE) Exam    Side: Right upper extremity  Side: Left upper extremity  Skin & Extremity Inspection: Skin color, temperature, and hair growth are WNL. No peripheral edema or cyanosis. No masses, redness, swelling, asymmetry, or associated skin lesions. No contractures.  Skin & Extremity Inspection: Skin color, temperature, and hair growth are WNL. No peripheral edema or cyanosis. No masses, redness, swelling, asymmetry, or associated skin lesions. No contractures.  Functional ROM: Unrestricted ROM          Functional ROM: Unrestricted ROM          Muscle Tone/Strength: Functionally intact. No obvious  neuro-muscular anomalies detected.  Muscle Tone/Strength: Functionally intact. No obvious neuro-muscular anomalies detected.  Sensory (Neurological): Unimpaired          Sensory (Neurological): Unimpaired          Palpation: No palpable anomalies              Palpation: No palpable anomalies              Specialized Test(s): Deferred         Specialized Test(s): Deferred          Thoracic Spine Area Exam  Skin & Axial Inspection: No masses, redness, or swelling Alignment: Symmetrical Functional ROM: Unrestricted ROM Stability: No instability detected Muscle Tone/Strength: Functionally intact. No obvious neuro-muscular anomalies detected. Sensory (Neurological): Unimpaired Muscle strength & Tone: No palpable anomalies  Lumbar Spine Area Exam  Skin & Axial Inspection: No masses, redness, or swelling Alignment: Symmetrical Functional ROM: Unrestricted ROM      Stability: No instability detected Muscle Tone/Strength: Functionally intact. No obvious neuro-muscular anomalies detected. Sensory (Neurological): Unimpaired Palpation: No palpable anomalies       Provocative Tests: Lumbar Hyperextension and rotation test: evaluation deferred today       Lumbar Lateral bending test: evaluation deferred today       Patrick's Maneuver: Improved after treatment                   Patient does have trigger point in her left low lumbar paraspinal region and superior gluteal region Gait & Posture Assessment  Ambulation: Limited Gait: Limited. Using assistive device to ambulate Posture: WNL   Lower Extremity Exam    Side: Right lower extremity  Side: Left lower extremity  Skin & Extremity Inspection: Skin color, temperature, and hair growth are WNL. No peripheral edema or cyanosis. No masses, redness, swelling, asymmetry, or associated skin lesions. No contractures.  Skin & Extremity Inspection: Skin color, temperature, and hair growth are WNL. No peripheral edema or cyanosis. No masses, redness,  swelling, asymmetry, or associated skin lesions. No contractures.  Functional ROM: Unrestricted  ROM          Functional ROM: Unrestricted ROM          Muscle Tone/Strength: Functionally intact. No obvious neuro-muscular anomalies detected.  Muscle Tone/Strength: Functionally intact. No obvious neuro-muscular anomalies detected.  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 Diagnoses of Myofascial pain, Chronic pain syndrome, and Recurrent major depressive disorder, remission status unspecified ([place]) were also pertinent to this visit.  Status Diagnosis  Improving Having a Flare-up Responding 1. 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 Myofascial pain   3. Chronic pain syndrome   4. Recurrent major depressive disorder, remission status unspecified ([place])       41 year old female with a history of axial low back pain left greater than right, left hip and SI joint pain that radiates into her left leg. 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 Patient does have a positive Patrick's on the left suggesting SI joint dysfunction. She states that she obtain significant pain relief for a couple of days after having a trigger point injection over lying her SI joint perhaps suggesting a myofascial pathology of her pain. Patient's lumbar spine MRI is unremarkable. Patient's thoracic MRI from 2005 shows very minor right posterior lateral disc protrusion at T7-T8 level at the T10-T11 level without nerve root compression. I do not believe this to be causing the majority of her back pain. Furthermore the patient's x-rays of her sacrum and coccyx were largely unremarkable although the patient does have pain with  palpation overlying her inferior portion of PSIS along with positive Patrick's on left greater than right.  Patient is status post bilateral sacroiliac joint injections on 09/29/2016 which provided her with significant pain relief. She endorses improvement in pain, improvement in functional status, ability to participant in activities of daily living to a greater extent. Patient does endorse a trigger point in her left lower lumbar paraspinal region and superior gluteal region. We discussed performing a trigger point injection for this. Patient agrees and would like to proceed.  In regards to medication management, patient continues on tramadol 100 mg twice daily as needed. She states that she stopped taking gabapentin because it made her feel hazy. Before I would consider any sort of opioid management, patient is to be evaluated by pain psychology for risk of substance abuse disorder.Patient does have history of depression and atypical posttraumatic stress disorder. This along with her age makes her high risk for chronic opioid therapy and I want her risk of substance abuse disorder to be further explored by way of a psychologist.  Plan: -Urine drug screen at next visit -Referral to pain psychology for risk of substance abuse disorder. After patient has completed this, we can discuss if opioid therapy will be appropriate for her at this clinic. -Can repeat sacroiliac joint injection after November 16 -Trigger point injection and left lumbar paraspinal and left superior gluteal region.  Lab-work, procedure(s), and/or referral(s): Orders Placed This Encounter  Procedures  . TRIGGER POINT INJECTION  . Ambulatory referral to Psychology     Provider-requested follow-up: Return for Procedure.  No future appointments.  Primary Care Physician: Shelda Pal, DO Location: [facility] Outpatient [facility] Management Facility Note by: Gillis Santa, M.D Date: 10/21/2016; Time: 1:02 PM  There are no  Patient Instructions  on file for this visit.

## 2016-10-21 NOTE — Progress Notes (Signed)
Safety precautions to be maintained throughout the outpatient stay will include: orient to surroundings, keep bed in low position, maintain call bell within reach at all times, provide assistance with transfer out of bed and ambulation.  

## 2016-10-22 DIAGNOSIS — M5416 Radiculopathy, lumbar region: Secondary | ICD-10-CM | POA: Diagnosis not present

## 2016-10-22 DIAGNOSIS — M9904 Segmental and somatic dysfunction of sacral region: Secondary | ICD-10-CM | POA: Diagnosis not present

## 2016-10-22 DIAGNOSIS — M9903 Segmental and somatic dysfunction of lumbar region: Secondary | ICD-10-CM | POA: Diagnosis not present

## 2016-10-22 DIAGNOSIS — M9905 Segmental and somatic dysfunction of pelvic region: Secondary | ICD-10-CM | POA: Diagnosis not present

## 2016-10-27 ENCOUNTER — Ambulatory Visit
Payer: BLUE CROSS/BLUE SHIELD | Attending: Student in an Organized Health Care Education/Training Program | Admitting: Student in an Organized Health Care Education/Training Program

## 2016-10-27 ENCOUNTER — Encounter: Payer: Self-pay | Admitting: Student in an Organized Health Care Education/Training Program

## 2016-10-27 VITALS — BP 121/78 | HR 72 | Temp 98.0°F | Resp 16 | Ht 72.0 in | Wt 190.0 lb

## 2016-10-27 DIAGNOSIS — M7918 Myalgia, other site: Secondary | ICD-10-CM | POA: Insufficient documentation

## 2016-10-27 DIAGNOSIS — M545 Low back pain: Secondary | ICD-10-CM | POA: Insufficient documentation

## 2016-10-27 DIAGNOSIS — G894 Chronic pain syndrome: Secondary | ICD-10-CM | POA: Diagnosis not present

## 2016-10-27 DIAGNOSIS — Z9889 Other specified postprocedural states: Secondary | ICD-10-CM | POA: Insufficient documentation

## 2016-10-27 DIAGNOSIS — Z881 Allergy status to other antibiotic agents status: Secondary | ICD-10-CM | POA: Insufficient documentation

## 2016-10-27 DIAGNOSIS — Z882 Allergy status to sulfonamides status: Secondary | ICD-10-CM | POA: Insufficient documentation

## 2016-10-27 MED ORDER — ROPIVACAINE HCL 2 MG/ML IJ SOLN
10.0000 mL | Freq: Once | INTRAMUSCULAR | Status: AC
  Administered 2016-10-27: 10 mL
  Filled 2016-10-27: qty 10

## 2016-10-27 NOTE — Progress Notes (Signed)
Safety precautions to be maintained throughout the outpatient stay will include: orient to surroundings, keep bed in low position, maintain call bell within reach at all times, provide assistance with transfer out of bed and ambulation.  

## 2016-10-27 NOTE — Patient Instructions (Signed)
Pain Management Discharge Instructions  General Discharge Instructions :  If you need to reach your doctor call: Monday-Friday 8:00 am - 4:00 pm at [phone] or toll free [phone].  After clinic hours [phone] to have operator reach doctor.  Bring all of your medication bottles to all your appointments in the pain clinic.  To cancel or reschedule your appointment with Pain Management please remember to call 24 hours in advance to avoid a fee.  Refer to the educational materials which you have been given on: General Risks, I had my Procedure. Discharge Instructions, Post Sedation.  Post Procedure Instructions:  The drugs you were given will stay in your system until tomorrow, so for the next 24 hours you should not drive, make any legal decisions or drink any alcoholic beverages.  You may eat anything you prefer, but it is better to start with liquids then soups and crackers, and gradually work up to solid foods.  Please notify your doctor immediately if you have any unusual bleeding, trouble breathing or pain that is not related to your normal pain.  Depending on the type of procedure that was done, some parts of your body may feel week and/or numb.  This usually clears up by tonight or the next day.  Walk with the use of an assistive device or accompanied by an adult for the 24 hours.  You may use ice on the affected area for the first 24 hours.  Put ice in a Ziploc bag and cover with a towel and place against area 15 minutes on 15 minutes off.  You may switch to heat after 24 hours.Trigger Point Injection Trigger points are areas where you have pain. A trigger point injection is a shot given in the trigger point to help relieve pain for a few days to a few months. Common places for trigger points include:  The neck.  The shoulders.  The upper back.  The lower back.  A trigger point injection will not cure long-lasting (chronic) pain permanently. These injections do  not always work for every person, but for some people they can help to relieve pain for a few days to a few months. Tell a health care provider about:  Any allergies you have.  All medicines you are taking, including vitamins, herbs, eye drops, creams, and over-the-counter medicines.  Any problems you or family members have had with anesthetic medicines.  Any blood disorders you have.  Any surgeries you have had.  Any medical conditions you have. What are the risks? Generally, this is a safe procedure. However, problems may occur, including:  Infection.  Bleeding.  Allergic reaction to the injected medicine.  Irritation of the skin around the injection site.  What happens before the procedure?  Ask your health care provider about changing or stopping your regular medicines. This is especially important if you are taking diabetes medicines or blood thinners. What happens during the procedure?  Your health care provider will feel for trigger points. A marker may be used to circle the area for the injection.  The skin over the trigger point will be washed with a germ-killing (antiseptic) solution.  A thin needle is used for the shot. You may feel pain or a twitching feeling when the needle enters the trigger point.  A numbing solution may be injected into the trigger point. Sometimes a medicine to keep down swelling, redness, and warmth (inflammation) is also injected.  Your health care provider may move the needle around the area where   the trigger point is located until the tightness and twitching goes away.  After the injection, your health care provider may put gentle pressure over the injection site.  The injection site will be covered with a bandage (dressing). The procedure may vary among health care providers and hospitals. What happens after the procedure?  The dressing can be taken off in a few hours or as told by your health care provider.  You may feel sore and  stiff for 1-2 days. This information is not intended to replace advice given to you by your health care provider. Make sure you discuss any questions you have with your health care provider. Document Released: 12/12/2010 Document Revised: 08/26/2015 Document Reviewed: 06/12/2014 Elsevier Interactive Patient Education  2018 [employer]  

## 2016-10-27 NOTE — Progress Notes (Signed)
Patient's Name: Gail Watkins  MRN: [id number]  Referring Provider: Sharlene Dory*  DOB: [birth year]  PCP: Sharlene Dory, DO  DOS: 10/27/2016  Note by: Edward Jolly, MD  Service setting: Ambulatory outpatient  Specialty: Interventional Pain Management  Patient type: Established  Location: [facility] (AMB) Pain Management Facility  Visit type: Interventional Procedure   Primary Reason for Visit: Interventional Pain Management Treatment. CC: Back Pain (left, lower)  Procedure:  Anesthesia, Analgesia, Anxiolysis:  Type: Trigger Point Injections CPT: 20552 Region:Right trapezius and lumbar midline.   Level: Lumbar midline And right trapezius   Type: Local Anesthesia Local Anesthetic: Lidocaine 1% Route: Infiltration ([place]/IM) IV Access: Declined Sedation: Declined  Indication(s): Analgesia           Indications: 1. Myofascial pain   2. Chronic pain syndrome    Pain Score: Pre-procedure: 6 /10 Post-procedure: 9 /10  Pre-op Assessment:  Gail Watkins is a 41 y.o. (year old), female patient, seen today for interventional treatment. She  has a past surgical history that includes Cesarean section; cystectomy; Dilation and curettage of uterus; and Ovarian cyst removal. Gail Watkins has a current medication list which includes the following prescription(s): diclofenac sodium, dicyclomine, ergocalciferol, hydroxyzine, melatonin, meloxicam, NON FORMULARY, omeprazole, tramadol, and gabapentin. Her primarily concern today is the Back Pain (left, lower)  Initial Vital Signs: Blood pressure 124/87, pulse 80, temperature 98 F (36.7 C), temperature source Oral, resp. rate 18, height 6' (1.829 m), weight 190 lb (86.2 kg), last menstrual period 10/14/2016, SpO2 100 %. BMI: Estimated body mass index is 25.77 kg/m as calculated from the following:   Height as of this encounter: 6' (1.829 m).   Weight as of this encounter: 190 lb (86.2 kg).  Risk Assessment: Allergies: Reviewed. She is  allergic to sulfa antibiotics; vancomycin; prednisone; reglan [metoclopramide]; and terbutaline.  Allergy Precautions: None required Coagulopathies: Reviewed. None identified.  Blood-thinner therapy: None at this time Active Infection(s): Reviewed. None identified. Gail Watkins is afebrile  Site Confirmation: Gail Watkins was asked to confirm the procedure and laterality before marking the site Procedure checklist: Completed Consent: Before the procedure and under the influence of no sedative(s), amnesic(s), or anxiolytics, the patient was informed of the treatment options, risks and possible complications. To fulfill our ethical and legal obligations, as recommended by the American Medical Association's Code of Ethics, I have informed the patient of my clinical impression; the nature and purpose of the treatment or procedure; the risks, benefits, and possible complications of the intervention; the alternatives, including doing nothing; the risk(s) and benefit(s) of the alternative treatment(s) or procedure(s); and the risk(s) and benefit(s) of doing nothing. The patient was provided information about the general risks and possible complications associated with the procedure. These may include, but are not limited to: failure to achieve desired goals, infection, bleeding, organ or nerve damage, allergic reactions, paralysis, and death. In addition, the patient was informed of those risks and complications associated to the procedure, such as failure to decrease pain; infection; bleeding; organ or nerve damage with subsequent damage to sensory, motor, and/or autonomic systems, resulting in permanent pain, numbness, and/or weakness of one or several areas of the body; allergic reactions; (i.e.: anaphylactic reaction); and/or death. Furthermore, the patient was informed of those risks and complications associated with the medications. These include, but are not limited to: allergic reactions (i.e.: anaphylactic  or anaphylactoid reaction(s)); adrenal axis suppression; blood sugar elevation that in diabetics may result in ketoacidosis or comma; water retention that in patients with history  of congestive heart failure may result in shortness of breath, pulmonary edema, and decompensation with resultant heart failure; weight gain; swelling or edema; medication-induced neural toxicity; particulate matter embolism and blood vessel occlusion with resultant organ, and/or nervous system infarction; and/or aseptic necrosis of one or more joints. Finally, the patient was informed that Medicine is not an exact science; therefore, there is also the possibility of unforeseen or unpredictable risks and/or possible complications that may result in a catastrophic outcome. The patient indicated having understood very clearly. We have given the patient no guarantees and we have made no promises. Enough time was given to the patient to ask questions, all of which were answered to the patient's satisfaction. Gail Watkins has indicated that she wanted to continue with the procedure. Attestation: I, the ordering provider, attest that I have discussed with the patient the benefits, risks, side-effects, alternatives, likelihood of achieving goals, and potential problems during recovery for the procedure that I have provided informed consent. Date: 10/27/2016; Time: 9:50 AM  Pre-Procedure Preparation:  Monitoring: As per clinic protocol. Respiration, ETCO2, SpO2, BP, heart rate and rhythm monitor placed and checked for adequate function Safety Precautions: Patient was assessed for positional comfort and pressure points before starting the procedure. Time-out: I initiated and conducted the "Time-out" before starting the procedure, as per protocol. The patient was asked to participate by confirming the accuracy of the "Time Out" information. Verification of the correct person, site, and procedure were performed and confirmed by me, the nursing  staff, and the patient. "Time-out" conducted as per Joint Commission's Universal Protocol (UP.01.01.01). "Time-out" Date & Time: 10/27/2016; 1000 hrs.  Description of Procedure Process:   Position: Prone Target Area: Trigger Point Approach: Ipsilateral approach. Area Prepped: Entire     Region Prepping solution: ChloraPrep (2% chlorhexidine gluconate and 70% isopropyl alcohol) Safety Precautions: Aspiration looking for blood return was conducted prior to all injections. At no point did we inject any substances, as a needle was being advanced. No attempts were made at seeking any paresthesias. Safe injection practices and needle disposal techniques used. Medications properly checked for expiration dates. SDV (single dose vial) medications used. Description of the Procedure: Protocol guidelines were followed. The patient was placed in position over the fluoroscopy table. The target area was identified and the area prepped in the usual manner. Skin desensitized using vapocoolant spray. Skin & deeper tissues infiltrated with local anesthetic. Appropriate amount of time allowed to pass for local anesthetics to take effect. The procedure needles were then advanced to the target area. Proper needle placement secured. Negative aspiration confirmed. Solution injected in intermittent fashion, asking for systemic symptoms every 0.5cc of injectate. The needles were then removed and the area cleansed, making sure to leave some of the prepping solution back to take advantage of its long term bactericidal properties. Vitals:   10/27/16 0930 10/27/16 1007  BP: 124/87 121/78  Pulse: 80 72  Resp: 18 16  Temp: 98 F (36.7 C)   TempSrc: Oral   SpO2: 100% 99%  Weight: 190 lb (86.2 kg)   Height: 6' (1.829 m)     Start Time: 1000 hrs. End Time: 1003 hrs. Materials:  Needle(s) Type: Epidural needle Gauge: 20G Length: 3.5-in Medication(s): We administered ropivacaine (PF) 2 mg/mL (0.2%). Please see chart orders  for dosing details. 1 cc of 0.2% ropivacaine at each TPI Imaging Guidance:  Type of Imaging Technique: None used Indication(s): N/A Exposure Time: No patient exposure Contrast: None used. Fluoroscopic Guidance: N/A Ultrasound Guidance: N/A Interpretation:  N/A  Antibiotic Prophylaxis:  Indication(s): None identified Antibiotic given: None  Post-operative Assessment:  EBL: None Complications: No immediate post-treatment complications observed by team, or reported by patient. Note: The patient tolerated the entire procedure well. A repeat set of vitals were taken after the procedure and the patient was kept under observation following institutional policy, for this type of procedure. Post-procedural neurological assessment was performed, showing return to baseline, prior to discharge. The patient was provided with post-procedure discharge instructions, including a section on how to identify potential problems. Should any problems arise concerning this procedure, the patient was given instructions to immediately contact us, at any time, without hesitation. In any case, we plan to contact the patient by telephone for a follow-up status report regarding this interventional procedure. Comments:  No additional relevant information.  Plan of Care   Imaging Orders  No imaging studies ordered today   Procedure Orders    No procedure(s) ordered today    Medications ordered for procedure: Meds ordered this encounter  Medications  . ropivacaine (PF) 2 mg/mL (0.2%) (NAROPIN) injection 10 mL   Medications administered: We administered ropivacaine (PF) 2 mg/mL (0.2%).  See the medical record for exact dosing, route, and time of administration.  New Prescriptions   No medications on file   Disposition: Discharge home  Discharge Date & Time: 10/27/2016;   hrs.   Physician-requested Follow-up: Return in about 2 weeks (around 11/10/2016) for Post Procedure Evaluation. Future  Appointments Date Time Provider Department Center  11/10/2016 9:30 AM Edward Jolly, MD [facility] None   Primary Care Physician: Sharlene Dory, DO Location: [facility] Outpatient Pain Management Facility Note by: Edward Jolly, MD Date: 10/27/2016; Time: 11:17 AM  Disclaimer:  Medicine is not an exact science. The only guarantee in medicine is that nothing is guaranteed. It is important to note that the decision to proceed with this intervention was based on the information collected from the patient. The Data and conclusions were drawn from the patient's questionnaire, the interview, and the physical examination. Because the information was provided in large part by the patient, it cannot be guaranteed that it has not been purposely or unconsciously manipulated. Every effort has been made to obtain as much relevant data as possible for this evaluation. It is important to note that the conclusions that lead to this procedure are derived in large part from the available data. Always take into account that the treatment will also be dependent on availability of resources and existing treatment guidelines, considered by other Pain Management Practitioners as being common knowledge and practice, at the time of the intervention. For Medico-Legal purposes, it is also important to point out that variation in procedural techniques and pharmacological choices are the acceptable norm. The indications, contraindications, technique, and results of the above procedure should only be interpreted and judged by a Board-Certified Interventional Pain Specialist with extensive familiarity and expertise in the same exact procedure and technique.

## 2016-11-03 DIAGNOSIS — M5416 Radiculopathy, lumbar region: Secondary | ICD-10-CM | POA: Diagnosis not present

## 2016-11-03 DIAGNOSIS — M9904 Segmental and somatic dysfunction of sacral region: Secondary | ICD-10-CM | POA: Diagnosis not present

## 2016-11-03 DIAGNOSIS — M9903 Segmental and somatic dysfunction of lumbar region: Secondary | ICD-10-CM | POA: Diagnosis not present

## 2016-11-03 DIAGNOSIS — M9905 Segmental and somatic dysfunction of pelvic region: Secondary | ICD-10-CM | POA: Diagnosis not present

## 2016-11-10 ENCOUNTER — Ambulatory Visit
Payer: BLUE CROSS/BLUE SHIELD | Attending: Student in an Organized Health Care Education/Training Program | Admitting: Student in an Organized Health Care Education/Training Program

## 2016-11-25 ENCOUNTER — Other Ambulatory Visit: Payer: Self-pay | Admitting: Family Medicine

## 2016-11-25 DIAGNOSIS — F410 Panic disorder [episodic paroxysmal anxiety] without agoraphobia: Secondary | ICD-10-CM

## 2016-11-25 DIAGNOSIS — M461 Sacroiliitis, not elsewhere classified: Secondary | ICD-10-CM

## 2016-11-25 NOTE — Telephone Encounter (Signed)
Requesting:   tramaodol Contract   none UDS   none Last OV    09/03/2016 Last Refill    #120 with 2 refills on 8.29.2018  Please Advise

## 2016-11-26 NOTE — Telephone Encounter (Signed)
Pt called back and states that she sees Dr Kern ReapBilal Latef in [place] for pain management. Reports that she has gotten 1 injection and will be getting another. She has also been told that she must see a "pain psychiatrist" in [place] before Dr Imagene RichesLatef will prescribe her oral pain medication. 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 She says that she has not signed a contract with pain management yet and that Dr Imagene RichesLatef told her she could request refill from PCP while she is waiting to get psych evaluation.  Please advise?

## 2016-11-26 NOTE — Telephone Encounter (Signed)
I don't mind refilling the Atarax. For the tramadol, isn't she seeing a pain specialist?

## 2016-11-26 NOTE — Telephone Encounter (Signed)
Per verbal from PCP, he will refill until pt can get refill from Pain management. Notified pt.

## 2016-11-26 NOTE — Telephone Encounter (Signed)
Called left message to call back 

## 2016-12-01 ENCOUNTER — Encounter: Payer: Self-pay | Admitting: Family Medicine

## 2016-12-01 ENCOUNTER — Ambulatory Visit (INDEPENDENT_AMBULATORY_CARE_PROVIDER_SITE_OTHER): Payer: BLUE CROSS/BLUE SHIELD | Admitting: Family Medicine

## 2016-12-01 ENCOUNTER — Ambulatory Visit: Payer: BLUE CROSS/BLUE SHIELD

## 2016-12-01 VITALS — BP 118/78 | HR 97 | Temp 99.0°F | Ht 72.0 in | Wt 185.5 lb

## 2016-12-01 DIAGNOSIS — J209 Acute bronchitis, unspecified: Secondary | ICD-10-CM

## 2016-12-01 MED ORDER — ALBUTEROL SULFATE 108 (90 BASE) MCG/ACT IN AEPB: 1.0000 | INHALATION_SPRAY | Freq: Four times a day (QID) | RESPIRATORY_TRACT | 0 refills | Status: DC | PRN

## 2016-12-01 MED ORDER — BENZONATATE 100 MG PO CAPS: 100.0000 mg | ORAL_CAPSULE | Freq: Three times a day (TID) | ORAL | 0 refills | Status: DC | PRN

## 2016-12-01 MED ORDER — PREDNISONE 20 MG PO TABS: 40.0000 mg | ORAL_TABLET | Freq: Every day | ORAL | 0 refills | Status: DC

## 2016-12-01 NOTE — Progress Notes (Signed)
Chief Complaint  Patient presents with  . Nausea  . Shortness of Breath  . Cough    Gail Watkins here for URI complaints.  Duration: 4 days  Associated symptoms: subjective fever, sinus congestion, sinus pain, rhinorrhea, sore throat, wheezing, shortness of breath and dry cough Denies: itchy watery eyes, ear drainage, myalgia and rigors Treatment to date: Mucinex, Alka Seltzer cold and cough Sick contacts: Yes- daughter  ROS:  Const: + subj fevers HEENT: As noted in HPI Lungs: No current SOB  Past Medical History:  Diagnosis Date  . Allergy   . Depression   . Eating disorder   . Fibromyalgia   . Hay fever   . History of chicken pox   . History of fainting spells of unknown cause   . Migraine   . Osteoarthritis   . Osteopenia   . Sacroiliitis (HCC)   . Scheurmann's disease    Family History  Problem Relation Age of Onset  . Alzheimer's disease Mother        Epo4+  . Microcephaly Father   . Heart attack Father   . Cancer Father        Prostate  . Stroke Father   . Diabetes Father   . Mental illness Father     BP 118/78 (BP Location: Left Arm, Patient Position: Sitting, Cuff Size: Large)   Pulse 97   Temp 99 F (37.2 C) (Oral)   Ht 6' (1.829 m)   Wt 185 lb 8 oz (84.1 kg)   SpO2 96%   BMI 25.16 kg/m  General: Awake, alert, appears stated age HEENT: AT, Point, ears patent b/l and TM's neg, nares patent w/o discharge, pharynx pink and without exudates, MMM Neck: No masses or asymmetry Heart: RRR, no bruits Lungs: CTAB, no accessory muscle use Psych: Age appropriate judgment and insight, normal mood and affect  Acute bronchitis, unspecified organism  Likely viral. Supportive care. Let us know if things change.  Continue to push fluids, practice good hand hygiene, cover mouth when coughing. F/u prn. If starting to experience fevers, shaking, or shortness of breath, seek immediate care. Pt voiced understanding and agreement to the plan.  Gail Watkins  [place], DO 12/01/16 5:27 PM

## 2016-12-01 NOTE — Progress Notes (Signed)
Pre visit review using our clinic review tool, if applicable. No additional management support is needed unless otherwise documented below in the visit note. 

## 2016-12-01 NOTE — Patient Instructions (Addendum)
OK to take Tylenol 1000 mg (2 extra strength tabs) or 975 mg (3 regular strength tabs) every 6 hours as needed.  Ibuprofen 400-600 mg (2-3 over the counter strength tabs) every 6 hours as needed for pain.  Continue to push fluids, practice good hand hygiene, and cover your mouth if you cough.  If you start having fevers, shaking or shortness of breath, seek immediate care.  Send MyChart message if you aren't improving by the end of the week or if you start to develop fevers >100.4 F.

## 2016-12-05 ENCOUNTER — Ambulatory Visit (INDEPENDENT_AMBULATORY_CARE_PROVIDER_SITE_OTHER): Payer: BLUE CROSS/BLUE SHIELD | Admitting: Family Medicine

## 2016-12-05 ENCOUNTER — Encounter: Payer: Self-pay | Admitting: Family Medicine

## 2016-12-05 ENCOUNTER — Telehealth: Payer: Self-pay | Admitting: Family Medicine

## 2016-12-05 VITALS — HR 93 | Ht 72.0 in | Wt 189.0 lb

## 2016-12-05 DIAGNOSIS — L600 Ingrowing nail: Secondary | ICD-10-CM

## 2016-12-05 MED ORDER — OXYCODONE HCL 5 MG PO CAPS: 5.0000 mg | ORAL_CAPSULE | Freq: Three times a day (TID) | ORAL | 0 refills | Status: DC | PRN

## 2016-12-05 MED ORDER — CLINDAMYCIN HCL 300 MG PO CAPS: 300.0000 mg | ORAL_CAPSULE | Freq: Three times a day (TID) | ORAL | 0 refills | Status: AC

## 2016-12-05 MED ORDER — OXYCODONE HCL 5 MG PO TABS: 5.0000 mg | ORAL_TABLET | Freq: Three times a day (TID) | ORAL | 0 refills | Status: DC | PRN

## 2016-12-05 NOTE — Patient Instructions (Addendum)
Do not shower for the rest of the day. When you do wash it, use only soap and water. Do not vigorously scrub. Apply triple antibiotic ointment (like Neosporin) twice daily. In general, keep the area clean and dry.   Twice daily soaks in iodine water (mix iodine with water). Warm water is preferable.   Things to look out for: increasing pain not relieved by ibuprofen/acetaminophen, fevers, spreading redness, drainage of pus, or foul odor.  Ibuprofen 400-600 mg (2-3 over the counter strength tabs) every 6 hours as needed for pain.  OK to take Tylenol 1000 mg (2 extra strength tabs) or 975 mg (3 regular strength tabs) every 6 hours as needed.  Use these routinely over the next couple days to stay ahead of the inflammation. Try Tramadol instead of oxy if tolerated.   Ice/cold pack over area for 10-15 min every 2-3 hours while awake.  Let us know if you need anything.

## 2016-12-05 NOTE — Telephone Encounter (Signed)
Copied from CRM [id number]. Topic: General - Other >> Dec 05, 2016  3:48 PM Percival SpanishKennedy, Cheryl W wrote: Reason for CRM     pt took the following rx to the pharmacy and they donot have  capsules and will not have it till Monday and they are asking to substitue    oxycodone (OXY-IR) 5 MG capsule  Pt said she can not go through the weekend in pain   Pt would like a call back    [phone]

## 2016-12-05 NOTE — Telephone Encounter (Signed)
Done

## 2016-12-05 NOTE — Telephone Encounter (Signed)
OK to sub. TY.

## 2016-12-05 NOTE — Progress Notes (Signed)
Pre visit review using our clinic review tool, if applicable. No additional management support is needed unless otherwise documented below in the visit note. 

## 2016-12-05 NOTE — Telephone Encounter (Signed)
Cannot get through to the pharmacy  Please send in alternative

## 2016-12-05 NOTE — Progress Notes (Addendum)
Chief Complaint  Patient presents with  . Ingrown Toenail    Subjective: Patient is a 41 y.o. female here for an ingrown toenail.  Started a couple days ago after a pedicure. Painful over R great toe (medially). She has hx of freq ones in the past, but home remedies are not particularly helpful at this time. Some pus is draining from the toe. No fevers or streaking redness.   ROS: Skin: as noted in HPI  Past Medical History:  Diagnosis Date  . Allergy   . Depression   . Eating disorder   . Fibromyalgia   . Hay fever   . History of chicken pox   . History of fainting spells of unknown cause   . Migraine   . Osteoarthritis   . Osteopenia   . Sacroiliitis (HCC)   . Scheurmann's disease     Objective: Pulse 93   Ht 6' (1.829 m)   Wt 189 lb (85.7 kg)   SpO2 97%   BMI 25.63 kg/m  General: Awake, appears stated age Heart: Brisk cap refill Lungs: No accessory muscle use Skin: medial nail fold ingrown, swollen and small amount of purulent material noted; mild erythema, +TTP over medial nail and nail pulp Psych: Age appropriate judgment and insight, normal affect and mood  Procedure note; complete toenail removal Informed consent obtained. The area was anesthetized with a four corner ring block using 1.5 mL of 2% lidocaine without epinephrine in each quadrant, a total of 6 mL. A tourniquet was placed around the base of the toe. Several minutes past while the initial anesthesia started to work. A small spatula was used to elevate the nail bed and disrupt the matrix. An English Anvil was used to elevate further and cut a portion of the nail longitudinally. A straight hemostat was then use to wrest the nail from place. The area under the skin was cauterized to prevent regrowth.  Adequate hemostasis was obtained and the tourniquet was removed. The patient tolerated the procedure well. There were no immediate complications noted.   Assessment and Plan: Ingrown toenail of  right foot with infection - Plan: oxycodone (OXY-IR) 5 MG capsule, clindamycin (CLEOCIN) 300 MG capsule, PR REMOVAL OF NAIL PLATE  Orders as above. Abx for paronychia. Oxy for breakthru pain. Aftercare instructions verbalized and written. Tylenol. NSAIDs.  Ice.  Keep ahead of inflammation. Iodine soaks BID.  Warning signs and symptoms verbalized and written down. Pt given a flat shoe for comfort.  F/u in 1 week to recheck toe.  The patient voiced understanding and agreement to the plan.  [name], DO 12/05/16  2:48 PM

## 2016-12-05 NOTE — Addendum Note (Signed)
Addended by: Radene GunningWENDLING, Aiman Noe P on: 12/05/2016 04:49 PM   Modules accepted: Orders

## 2016-12-12 ENCOUNTER — Ambulatory Visit (INDEPENDENT_AMBULATORY_CARE_PROVIDER_SITE_OTHER): Payer: BLUE CROSS/BLUE SHIELD | Admitting: Family Medicine

## 2016-12-12 DIAGNOSIS — L600 Ingrowing nail: Secondary | ICD-10-CM

## 2016-12-12 NOTE — Progress Notes (Signed)
Patient is here for recheck of partial nail avulsion for ingrown toenail 1 week ago.  She was compliant with iodine soaks.  Pain is well controlled.  Swelling and redness of gone down.  She is finishing the antibiotic.  No drainage.  All questions answered.  Follow-up as needed.  Gail Rocheicholas Paul [place], DO 12/12/16 3:53 PM

## 2016-12-18 DIAGNOSIS — R112 Nausea with vomiting, unspecified: Secondary | ICD-10-CM | POA: Diagnosis not present

## 2016-12-18 DIAGNOSIS — R05 Cough: Secondary | ICD-10-CM | POA: Diagnosis not present

## 2016-12-18 DIAGNOSIS — R197 Diarrhea, unspecified: Secondary | ICD-10-CM | POA: Diagnosis not present

## 2016-12-18 DIAGNOSIS — F320 Major depressive disorder, single episode, mild: Secondary | ICD-10-CM | POA: Diagnosis not present

## 2016-12-18 DIAGNOSIS — R1032 Left lower quadrant pain: Secondary | ICD-10-CM | POA: Diagnosis not present

## 2016-12-18 DIAGNOSIS — Z87891 Personal history of nicotine dependence: Secondary | ICD-10-CM | POA: Diagnosis not present

## 2016-12-18 DIAGNOSIS — J019 Acute sinusitis, unspecified: Secondary | ICD-10-CM | POA: Diagnosis not present

## 2016-12-24 ENCOUNTER — Other Ambulatory Visit: Payer: Self-pay | Admitting: Family Medicine

## 2016-12-24 DIAGNOSIS — M461 Sacroiliitis, not elsewhere classified: Secondary | ICD-10-CM

## 2016-12-24 NOTE — Telephone Encounter (Signed)
Last office visit on 12/12/2016 Last refill on 11/26/2016  #120 no refills

## 2016-12-25 ENCOUNTER — Other Ambulatory Visit: Payer: Self-pay

## 2016-12-25 ENCOUNTER — Ambulatory Visit
Payer: BLUE CROSS/BLUE SHIELD | Attending: Student in an Organized Health Care Education/Training Program | Admitting: Student in an Organized Health Care Education/Training Program

## 2016-12-25 ENCOUNTER — Encounter: Payer: Self-pay | Admitting: Student in an Organized Health Care Education/Training Program

## 2016-12-25 VITALS — BP 137/84 | HR 80 | Temp 98.2°F | Ht 72.0 in | Wt 185.0 lb

## 2016-12-25 DIAGNOSIS — M797 Fibromyalgia: Secondary | ICD-10-CM | POA: Insufficient documentation

## 2016-12-25 DIAGNOSIS — Z79899 Other long term (current) drug therapy: Secondary | ICD-10-CM | POA: Diagnosis not present

## 2016-12-25 DIAGNOSIS — F339 Major depressive disorder, recurrent, unspecified: Secondary | ICD-10-CM | POA: Insufficient documentation

## 2016-12-25 DIAGNOSIS — G894 Chronic pain syndrome: Secondary | ICD-10-CM | POA: Diagnosis not present

## 2016-12-25 DIAGNOSIS — F4310 Post-traumatic stress disorder, unspecified: Secondary | ICD-10-CM | POA: Diagnosis not present

## 2016-12-25 DIAGNOSIS — M4698 Unspecified inflammatory spondylopathy, sacral and sacrococcygeal region: Secondary | ICD-10-CM

## 2016-12-25 DIAGNOSIS — M533 Sacrococcygeal disorders, not elsewhere classified: Secondary | ICD-10-CM | POA: Insufficient documentation

## 2016-12-25 DIAGNOSIS — M545 Low back pain: Secondary | ICD-10-CM | POA: Insufficient documentation

## 2016-12-25 DIAGNOSIS — F410 Panic disorder [episodic paroxysmal anxiety] without agoraphobia: Secondary | ICD-10-CM | POA: Insufficient documentation

## 2016-12-25 DIAGNOSIS — M47818 Spondylosis without myelopathy or radiculopathy, sacral and sacrococcygeal region: Secondary | ICD-10-CM

## 2016-12-25 NOTE — Patient Instructions (Addendum)
-bilateral SI joint injection with sedation Mon Jan 14GENERAL RISKS AND COMPLICATIONS  What are the risk, side effects and possible complications? Generally speaking, most procedures are safe.  However, with any procedure there are risks, side effects, and the possibility of complications.  The risks and complications are dependent upon the sites that are lesioned, or the type of nerve block to be performed.  The closer the procedure is to the spine, the more serious the risks are.  Great care is taken when placing the radio frequency needles, block needles or lesioning probes, but sometimes complications can occur. 1. Infection: Any time there is an injection through the skin, there is a risk of infection.  This is why sterile conditions are used for these blocks.  There are four possible types of infection. 1. Localized skin infection. 2. Central Nervous System Infection-This can be in the form of Meningitis, which can be deadly. 3. Epidural Infections-This can be in the form of an epidural abscess, which can cause pressure inside of the spine, causing compression of the spinal cord with subsequent paralysis. This would require an emergency surgery to decompress, and there are no guarantees that the patient would recover from the paralysis. 4. Discitis-This is an infection of the intervertebral discs.  It occurs in about 1% of discography procedures.  It is difficult to treat and it may lead to surgery.        2. Pain: the needles have to go through skin and soft tissues, will cause soreness.       3. Damage to internal structures:  The nerves to be lesioned may be near blood vessels or    other nerves which can be potentially damaged.       4. Bleeding: Bleeding is more common if the patient is taking blood thinners such as  aspirin, Coumadin, Ticiid, Plavix, etc., or if he/she have some genetic predisposition  such as hemophilia. Bleeding into the spinal canal can cause compression of the spinal   cord with subsequent paralysis.  This would require an emergency surgery to  decompress and there are no guarantees that the patient would recover from the  paralysis.       5. Pneumothorax:  Puncturing of a lung is a possibility, every time a needle is introduced in  the area of the chest or upper back.  Pneumothorax refers to free air around the  collapsed lung(s), inside of the thoracic cavity (chest cavity).  Another two possible  complications related to a similar event would include: Hemothorax and Chylothorax.   These are variations of the Pneumothorax, where instead of air around the collapsed  lung(s), you may have blood or chyle, respectively.       6. Spinal headaches: They may occur with any procedures in the area of the spine.       7. Persistent CSF (Cerebro-Spinal Fluid) leakage: This is a rare problem, but may occur  with prolonged intrathecal or epidural catheters either due to the formation of a fistulous  track or a dural tear.       8. Nerve damage: By working so close to the spinal cord, there is always a possibility of  nerve damage, which could be as serious as a permanent spinal cord injury with  paralysis.       9. Death:  Although rare, severe deadly allergic reactions known as "Anaphylactic  reaction" can occur to any of the medications used.      10. Worsening of the  symptoms:  We can always make thing worse.  What are the chances of something like this happening? Chances of any of this occuring are extremely low.  By statistics, you have more of a chance of getting killed in a motor vehicle accident: while driving to the hospital than any of the above occurring .  Nevertheless, you should be aware that they are possibilities.  In general, it is similar to taking a shower.  Everybody knows that you can slip, hit your head and get killed.  Does that mean that you should not shower again?  Nevertheless always keep in mind that statistics do not mean anything if you happen to be on  the wrong side of them.  Even if a procedure has a 1 (one) in a 1,000,000 (million) chance of going wrong, it you happen to be that one..Also, keep in mind that by statistics, you have more of a chance of having something go wrong when taking medications.  Who should not have this procedure? If you are on a blood thinning medication (e.g. Coumadin, Plavix, see list of "Blood Thinners"), or if you have an active infection going on, you should not have the procedure.  If you are taking any blood thinners, please inform your physician.  How should I prepare for this procedure?  Do not eat or drink anything at least six hours prior to the procedure.  Bring a driver with you .  It cannot be a taxi.  Come accompanied by an adult that can drive you back, and that is strong enough to help you if your legs get weak or numb from the local anesthetic.  Take all of your medicines the morning of the procedure with just enough water to swallow them.  If you have diabetes, make sure that you are scheduled to have your procedure done first thing in the morning, whenever possible.  If you have diabetes, take only half of your insulin dose and notify our nurse that you have done so as soon as you arrive at the clinic.  If you are diabetic, but only take blood sugar pills (oral hypoglycemic), then do not take them on the morning of your procedure.  You may take them after you have had the procedure.  Do not take aspirin or any aspirin-containing medications, at least eleven (11) days prior to the procedure.  They may prolong bleeding.  Wear loose fitting clothing that may be easy to take off and that you would not mind if it got stained with Betadine or blood.  Do not wear any jewelry or perfume  Remove any nail coloring.  It will interfere with some of our monitoring equipment.  NOTE: Remember that this is not meant to be interpreted as a complete list of all possible complications.  Unforeseen problems  may occur.  BLOOD THINNERS The following drugs contain aspirin or other products, which can cause increased bleeding during surgery and should not be taken for 2 weeks prior to and 1 week after surgery.  If you should need take something for relief of minor pain, you may take acetaminophen which is found in Tylenol,m Datril, Anacin-3 and Panadol. It is not blood thinner. The products listed below are.  Do not take any of the products listed below in addition to any listed on your instruction sheet.  A.P.C or A.P.C with Codeine Codeine Phosphate Capsules #3 Ibuprofen Ridaura  ABC compound Congesprin Imuran rimadil  Advil Cope Indocin Robaxisal  Alka-Seltzer Effervescent Pain Reliever  and Antacid Coricidin or Coricidin-D  Indomethacin Rufen  Alka-Seltzer plus Cold Medicine Cosprin Ketoprofen S-A-C Tablets  Anacin Analgesic Tablets or Capsules Coumadin Korlgesic Salflex  Anacin Extra Strength Analgesic tablets or capsules CP-2 Tablets Lanoril Salicylate  Anaprox Cuprimine Capsules Levenox Salocol  Anexsia-D Dalteparin Magan Salsalate  Anodynos Darvon compound Magnesium Salicylate Sine-off  Ansaid Dasin Capsules Magsal Sodium Salicylate  Anturane Depen Capsules Marnal Soma  APF Arthritis pain formula Dewitt's Pills Measurin Stanback  Argesic Dia-Gesic Meclofenamic Sulfinpyrazone  Arthritis Bayer Timed Release Aspirin Diclofenac Meclomen Sulindac  Arthritis pain formula Anacin Dicumarol Medipren Supac  Analgesic (Safety coated) Arthralgen Diffunasal Mefanamic Suprofen  Arthritis Strength Bufferin Dihydrocodeine Mepro Compound Suprol  Arthropan liquid Dopirydamole Methcarbomol with Aspirin Synalgos  ASA tablets/Enseals Disalcid Micrainin Tagament  Ascriptin Doan's Midol Talwin  Ascriptin A/D Dolene Mobidin Tanderil  Ascriptin Extra Strength Dolobid Moblgesic Ticlid  Ascriptin with Codeine Doloprin or Doloprin with Codeine Momentum Tolectin  Asperbuf Duoprin Mono-gesic Trendar  Aspergum  Duradyne Motrin or Motrin IB Triminicin  Aspirin plain, buffered or enteric coated Durasal Myochrisine Trigesic  Aspirin Suppositories Easprin Nalfon Trillsate  Aspirin with Codeine Ecotrin Regular or Extra Strength Naprosyn Uracel  Atromid-S Efficin Naproxen Ursinus  Auranofin Capsules Elmiron Neocylate Vanquish  Axotal Emagrin Norgesic Verin  Azathioprine Empirin or Empirin with Codeine Normiflo Vitamin E  Azolid Emprazil Nuprin Voltaren  Bayer Aspirin plain, buffered or children's or timed BC Tablets or powders Encaprin Orgaran Warfarin Sodium  Buff-a-Comp Enoxaparin Orudis Zorpin  Buff-a-Comp with Codeine Equegesic Os-Cal-Gesic   Buffaprin Excedrin plain, buffered or Extra Strength Oxalid   Bufferin Arthritis Strength Feldene Oxphenbutazone   Bufferin plain or Extra Strength Feldene Capsules Oxycodone with Aspirin   Bufferin with Codeine Fenoprofen Fenoprofen Pabalate or Pabalate-SF   Buffets II Flogesic Panagesic   Buffinol plain or Extra Strength Florinal or Florinal with Codeine Panwarfarin   Buf-Tabs Flurbiprofen Penicillamine   Butalbital Compound Four-way cold tablets Penicillin   Butazolidin Fragmin Pepto-Bismol   Carbenicillin Geminisyn Percodan   Carna Arthritis Reliever Geopen Persantine   Carprofen Gold's salt Persistin   Chloramphenicol Goody's Phenylbutazone   Chloromycetin Haltrain Piroxlcam   Clmetidine heparin Plaquenil   Cllnoril Hyco-pap Ponstel   Clofibrate Hydroxy chloroquine Propoxyphen         Before stopping any of these medications, be sure to consult the physician who ordered them.  Some, such as Coumadin (Warfarin) are ordered to prevent or treat serious conditions such as "deep thrombosis", "pumonary embolisms", and other heart problems.  The amount of time that you may need off of the medication may also vary with the medication and the reason for which you were taking it.  If you are taking any of these medications, please make sure you notify your pain  physician before you undergo any procedures.         Sacroiliac (SI) Joint Injection Patient Information  Description: The sacroiliac joint connects the scrum (very low back and tailbone) to the ilium (a pelvic bone which also forms half of the hip joint).  Normally this joint experiences very little motion.  When this joint becomes inflamed or unstable low back and or hip and pelvis pain may result.  Injection of this joint with local anesthetics (numbing medicines) and steroids can provide diagnostic information and reduce pain.  This injection is performed with the aid of x-ray guidance into the tailbone area while you are lying on your stomach.   You may experience an electrical sensation down the leg while this is being  done.  You may also experience numbness.  We also may ask if we are reproducing your normal pain during the injection.  Conditions which may be treated SI injection:   Low back, buttock, hip or leg pain  Preparation for the Injection:  1. Do not eat any solid food or dairy products within 8 hours of your appointment.  2. You may drink clear liquids up to 3 hours before appointment.  Clear liquids include water, black coffee, juice or soda.  No milk or cream please. 3. You may take your regular medications, including pain medications with a sip of water before your appointment.  Diabetics should hold regular insulin (if take separately) and take 1/2 normal NPH dose the morning of the procedure.  Carry some sugar containing items with you to your appointment. 4. A driver must accompany you and be prepared to drive you home after your procedure. 5. Bring all of your current medications with you. 6. An IV may be inserted and sedation may be given at the discretion of the physician. 7. A blood pressure cuff, EKG and other monitors will often be applied during the procedure.  Some patients may need to have extra oxygen administered for a short period.  8. You will be asked  to provide medical information, including your allergies, prior to the procedure.  We must know immediately if you are taking blood thinners (like Coumadin/Warfarin) or if you are allergic to IV iodine contrast (dye).  We must know if you could possible be pregnant.  Possible side effects:   Bleeding from needle site  Infection (rare, may require surgery)  Nerve injury (rare)  Numbness & tingling (temporary)  A brief convulsion or seizure  Light-headedness (temporary)  Pain at injection site (several days)  Decreased blood pressure (temporary)  Weakness in the leg (temporary)   Call if you experience:   New onset weakness or numbness of an extremity below the injection site that last more than 8 hours.  Hives or difficulty breathing ( go to the emergency room)  Inflammation or drainage at the injection site  Any new symptoms which are concerning to you  Please note:  Although the local anesthetic injected can often make your back/ hip/ buttock/ leg feel good for several hours after the injections, the pain will likely return.  It takes 3-7 days for steroids to work in the sacroiliac area.  You may not notice any pain relief for at least that one week.  If effective, we will often do a series of three injections spaced 3-6 weeks apart to maximally decrease your pain.  After the initial series, we generally will wait some months before a repeat injection of the same type.  If you have any questions, please call [phone] [facility]   Patient verbalizes understanding regarding having a driver with her and nothing by mouth for 8 hours prior to procedure.

## 2016-12-25 NOTE — Progress Notes (Signed)
Patient's Name: Gail Watkins  MRN: [id number]  Referring Provider: Shelda Pal*  DOB: 02/14/1975  PCP: Shelda Pal, DO  DOS: 12/25/2016  Note by: Gillis Santa, MD  Service setting: Ambulatory outpatient  Specialty: Interventional Pain Management  Location: [facility] (AMB) Pain Management Facility    Patient type: Established   Primary Reason(s) for Visit: Encounter for post-procedure evaluation of chronic illness with mild to moderate exacerbation CC: Back Pain (lower)  HPI  Gail Watkins is a 41 y.o. year old, female patient, who comes today for a post-procedure evaluation. She has Sacroiliitis (HCC) and Panic attack on their problem list. Her primarily concern today is the Back Pain (lower)  Pain Assessment: Location: Lower Back Radiating: Radiates outward to hips and down legs to feet. Onset: More than a month ago Duration: Chronic pain Quality: Shooting, Aching, Radiating Severity: 8 /10 (self-reported pain score)  Note: Reported level is inconsistent with clinical observations. Clinically the patient looks like a 2/10 A 2/10 is viewed as "Mild to Moderate" and described as noticeable and distracting. Impossible to hide from other people. More frequent flare-ups. Still possible to adapt and function close to normal. It can be very annoying and may have occasional stronger flare-ups. With discipline, patients may get used to it and adapt.       When using our objective Pain Scale, levels between 6 and 10/10 are said to belong in an emergency room, as it progressively worsens from a 6/10, described as severely limiting, requiring emergency care not usually available at an outpatient pain management facility. At a 6/10 level, communication becomes difficult and requires great effort. Assistance to reach the emergency department may be required. Facial flushing and profuse sweating along with potentially dangerous increases in heart rate and blood pressure will be evident. Effect  on ADL:   Timing: Intermittent Modifying factors: exercise, heat  Gail Watkins comes in today for post-procedure evaluation after the treatment done on 11/10/2016.  Further details on both, my assessment(s), as well as the proposed treatment plan, please see below.  Post-Procedure Assessment  11/10/2016 Procedure: trigger point injections in her right trapezius and lumbar region Pre-procedure pain score:  6/10 Post-procedure pain score: 9/10         Influential Factors: BMI: 25.09 kg/m Intra-procedural challenges: None observed.         Assessment challenges: None detected.              Reported side-effects: None.        Post-procedural adverse reactions or complications: None reported          Current benefits: Defined as reported results that persistent at this point in time.   Analgesia: 0-25 %            Function: No improvement ROM: No improvement Interpretative annotation: Recurrence of symptoms. Limited therapeutic benefit. Results would argue against repeating therapy.          Interpretation: Results would suggest therapy to have a limited impact on the patient's condition.                  Plan:  Please see "Plan of Care" for details.        Laboratory Chemistry  Inflammation Markers (CRP: Acute Phase) (ESR: Chronic Phase) No results found for: CRP, ESRSEDRATE, LATICACIDVEN               Rheumatology Markers No results found for: RF, ANA, LABURIC, URICUR, LYMEIGGIGMAB, LYMEABIGMQN  Renal Function Markers No results found for: BUN, CREATININE, GFRAA, GFRNONAA               Hepatic Function Markers No results found for: AST, ALT, ALBUMIN, ALKPHOS, HCVAB, AMYLASE, LIPASE, AMMONIA               Electrolytes No results found for: NA, K, CL, CALCIUM, MG, PHOS               Neuropathy Markers No results found for: VITAMINB12, FOLATE, HGBA1C, HIV               Bone Pathology Markers No results found for: VD25OH, [id number], [id number], [id number],  25OHVITD1, 25OHVITD2, 25OHVITD3, TESTOFREE, TESTOSTERONE               Coagulation Parameters Lab Results  Component Value Date   INR 1.0 12/29/2006   LABPROT 13.1 12/29/2006   APTT 30 12/29/2006   PLT 152 12/31/2006                 Cardiovascular Markers Lab Results  Component Value Date   HGB 8.9 DELTA CHECK NOTED (L) 12/31/2006   HCT 26.5 (L) 12/31/2006                 CA Markers No results found for: CEA, CA125, LABCA2               Note: Lab results reviewed.   Meds   Current Outpatient Medications:  .  Albuterol Sulfate 108 (90 Base) MCG/ACT AEPB, Inhale 1 puff into the lungs every 6 (six) hours as needed (SOB, wheezing)., Disp: 1 each, Rfl: 0 .  amoxicillin-clavulanate (AUGMENTIN) 875-125 MG tablet, Take 1 tablet by mouth 2 (two) times daily., Disp: , Rfl:  .  Diclofenac Sodium (VOLTAREN PO), Take by mouth., Disp: , Rfl:  .  dicyclomine (BENTYL) 10 MG capsule, Take 1 capsule (10 mg total) by mouth 4 (four) times daily -  before meals and at bedtime., Disp: 120 capsule, Rfl: 2 .  ergocalciferol (VITAMIN D2) 50000 units capsule, Take 50,000 Units by mouth once a week., Disp: , Rfl:  .  hydrOXYzine (ATARAX/VISTARIL) 50 MG tablet, TAKE 1 TABLET(50 MG) BY MOUTH THREE TIMES DAILY AS NEEDED FOR ANXIETY, Disp: 90 tablet, Rfl: 1 .  Melatonin 5 MG TABS, Take 10 tablets by mouth at bedtime., Disp: , Rfl:  .  meloxicam (MOBIC) 7.5 MG tablet, Take 1 tablet (7.5 mg total) by mouth 2 (two) times daily., Disp: 30 tablet, Rfl: 0 .  NON FORMULARY, 1 tablet 3 (three) times daily. CBD pills, Disp: , Rfl:  .  omeprazole (PRILOSEC) 40 MG capsule, Take 1 capsule (40 mg total) by mouth daily., Disp: 30 capsule, Rfl: 2 .  oxyCODONE (OXY IR/ROXICODONE) 5 MG immediate release tablet, Take 1 tablet (5 mg total) by mouth every 8 (eight) hours as needed for severe pain., Disp: 10 tablet, Rfl: 0 .  predniSONE (DELTASONE) 20 MG tablet, Take 2 tablets (40 mg total) by mouth daily with breakfast., Disp:  10 tablet, Rfl: 0 .  traMADol (ULTRAM) 50 MG tablet, TAKE 2 TABLETS BY MOUTH EVERY 12 HOURS AS NEEDED FOR MODERATE PAIN, Disp: 120 tablet, Rfl: 0 .  benzonatate (TESSALON) 100 MG capsule, Take 1 capsule (100 mg total) by mouth 3 (three) times daily as needed. (Patient not taking: Reported on 12/25/2016), Disp: 30 capsule, Rfl: 0 .  gabapentin (NEURONTIN) 300 MG capsule, Take 1 capsule (300 mg total) by mouth 3 (three) times daily. (  Patient not taking: Reported on 12/25/2016), Disp: 90 capsule, Rfl: 2  ROS  Constitutional: Denies any fever or chills Gastrointestinal: No reported hemesis, hematochezia, vomiting, or acute GI distress Musculoskeletal: Denies any acute onset joint swelling, redness, loss of ROM, or weakness Neurological: No reported episodes of acute onset apraxia, aphasia, dysarthria, agnosia, amnesia, paralysis, loss of coordination, or loss of consciousness  Allergies  Ms. Gronau is allergic to sulfa antibiotics; vancomycin; reglan [metoclopramide]; and terbutaline.  [place]  Drug: Ms. Point  reports that she does not use drugs. Alcohol:  reports that she drinks alcohol. Tobacco:  reports that she has been smoking cigarettes.  She started smoking about 24 years ago. she has never used smokeless tobacco. Medical:  has a past medical history of Allergy, Depression, Eating disorder, Fibromyalgia, Hay fever, History of chicken pox, History of fainting spells of unknown cause, Migraine, Osteoarthritis, Osteopenia, Sacroiliitis ([place]), and Scheurmann's disease. Surgical: Ms. Fetsch  has a past surgical history that includes Cesarean section; cystectomy; Dilation and curettage of uterus; and Ovarian cyst removal. Family: family history includes Alzheimer's disease in her mother; Cancer in her father; Diabetes in her father; Heart attack in her father; Mental illness in her father; Microcephaly in her father; Stroke in her father.  Constitutional Exam  General appearance: Well nourished,  well developed, and well hydrated. In no apparent acute distress Vitals:   12/25/16 1010  BP: 137/84  Pulse: 80  Temp: 98.2 F (36.8 C)  Weight: 185 lb (83.9 kg)  Height: 6' (1.829 m)   BMI Assessment: Estimated body mass index is 25.09 kg/m as calculated from the following:   Height as of this encounter: 6' (1.829 m).   Weight as of this encounter: 185 lb (83.9 kg).  BMI interpretation table: BMI level Category Range association with higher incidence of chronic pain  <18 kg/m2 Underweight   18.5-24.9 kg/m2 Ideal body weight   25-29.9 kg/m2 Overweight Increased incidence by 20%  30-34.9 kg/m2 Obese (Class I) Increased incidence by 68%  35-39.9 kg/m2 Severe obesity (Class II) Increased incidence by 136%  >40 kg/m2 Extreme obesity (Class III) Increased incidence by 254%   BMI Readings from Last 4 Encounters:  12/25/16 25.09 kg/m  12/05/16 25.63 kg/m  12/01/16 25.16 kg/m  10/27/16 25.77 kg/m   Wt Readings from Last 4 Encounters:  12/25/16 185 lb (83.9 kg)  12/05/16 189 lb (85.7 kg)  12/01/16 185 lb 8 oz (84.1 kg)  10/27/16 190 lb (86.2 kg)  Psych/Mental status: Alert, oriented x 3 (person, place, & time)       Eyes: PERLA Respiratory: No evidence of acute respiratory distress  Cervical Spine Area Exam  Skin & Axial Inspection: No masses, redness, edema, swelling, or associated skin lesions Alignment: Symmetrical Functional ROM: Unrestricted ROM      Stability: No instability detected Muscle Tone/Strength: Functionally intact. No obvious neuro-muscular anomalies detected. Sensory (Neurological): Unimpaired Palpation: No palpable anomalies              Upper Extremity (UE) Exam    Side: Right upper extremity  Side: Left upper extremity  Skin & Extremity Inspection: Skin color, temperature, and hair growth are WNL. No peripheral edema or cyanosis. No masses, redness, swelling, asymmetry, or associated skin lesions. No contractures.  Skin & Extremity Inspection: Skin  color, temperature, and hair growth are WNL. No peripheral edema or cyanosis. No masses, redness, swelling, asymmetry, or associated skin lesions. No contractures.  Functional ROM: Unrestricted ROM  Functional ROM: Unrestricted ROM          Muscle Tone/Strength: Functionally intact. No obvious neuro-muscular anomalies detected.  Muscle Tone/Strength: Functionally intact. No obvious neuro-muscular anomalies detected.  Sensory (Neurological): Unimpaired          Sensory (Neurological): Unimpaired          Palpation: No palpable anomalies              Palpation: No palpable anomalies              Specialized Test(s): Deferred         Specialized Test(s): Deferred          Thoracic Spine Area Exam  Skin & Axial Inspection: No masses, redness, or swelling Alignment: Symmetrical Functional ROM: Unrestricted ROM Stability: No instability detected Muscle Tone/Strength: Functionally intact. No obvious neuro-muscular anomalies detected. Sensory (Neurological): Unimpaired Muscle strength & Tone: No palpable anomalies  Lumbar Spine Area Exam  Skin & Axial Inspection: No masses, redness, or swelling Alignment: Symmetrical Functional ROM: Unrestricted ROM      Stability: No instability detected Muscle Tone/Strength: Functionally intact. No obvious neuro-muscular anomalies detected. Sensory (Neurological): Unimpaired Palpation: No palpable anomalies       Provocative Tests: Lumbar Hyperextension and rotation test: Positive bilaterally for facet joint pain. Left greater than right Lumbar Lateral bending test: Positive ipsilateral radicular pain, on the left. Positive for left-sided foraminal stenosis. Patrick's Maneuver: Positive for bilateral S-I arthralgia Left greater than right.        Gait & Posture Assessment  Ambulation: Patient ambulates using a cane Gait: Antalgic Posture: WNL   Lower Extremity Exam    Side: Right lower extremity  Side: Left lower extremity  Skin & Extremity  Inspection: Skin color, temperature, and hair growth are WNL. No peripheral edema or cyanosis. No masses, redness, swelling, asymmetry, or associated skin lesions. No contractures.  Skin & Extremity Inspection: Skin color, temperature, and hair growth are WNL. No peripheral edema or cyanosis. No masses, redness, swelling, asymmetry, or associated skin lesions. No contractures.  Functional ROM: Unrestricted ROM          Functional ROM: Unrestricted ROM          Muscle Tone/Strength: Functionally intact. No obvious neuro-muscular anomalies detected.  Muscle Tone/Strength: Functionally intact. No obvious neuro-muscular anomalies detected.  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 Diagnoses of Chronic pain syndrome, Recurrent major depressive disorder, remission status unspecified ([place]), and PTSD (post-traumatic stress disorder) were also pertinent to this visit.  Status Diagnosis  Having a Flare-up Controlled Controlled 1. 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 Chronic pain syndrome   3. Recurrent major depressive disorder, remission status unspecified ([place])   4. PTSD (post-traumatic stress disorder)      41 year old female with a history of axial low back pain left greater than right, left hip and SI joint pain that radiates into her left leg. 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 Patient does have a positive Patrick's on the left suggesting SI joint dysfunction. Patient's lumbar spine MRI is unremarkable. Patient's thoracic MRI from 2005 shows very minor right posterior lateral disc protrusion at T7-T8 level at the T10-T11 level without nerve root compression. I do not  believe this to be causing the  majority of her back pain. Furthermore the patient's x-rays of her sacrum and coccyx were largely unremarkable although the patient does have pain with palpation overlying her inferior portion of PSIS along with positive Patrick's on left greater than right.  Patient is status post bilateral sacroiliac joint injection on September 29, 2016 for SI joint arthritis as well as trigger point injections in her right trapezius and lumbar region on 10/27/2016 for myofascial pain syndrome.  Patient states that the trigger point injections were not that helpful for her myofascial pain.  She finds the SI joint injections to offer greater benefit in terms of left hip pain relief and improve range of motion.  We will plan on repeating bilateral SI joint injection under fluoroscopy.  Plan: -Repeat bilateral SI joint injection for SI joint arthralgia under IV sedation and fluoroscopy.  Time Note: Greater than 50% of the 25 minute(s) of face-to-face time spent with Ms. Sappington, was spent in counseling/coordination of care regarding: Ms. Dubiel primary cause of pain, the treatment plan, treatment alternatives, the risks and possible complications of proposed treatment and the results, interpretation and significance of  her recent diagnostic interventional treatment(s).  Lab-work, procedure(s), and/or referral(s): Orders Placed This Encounter  Procedures  . SACROILIAC JOINT INJECTION    Provider-requested follow-up: Return in about 4 weeks (around 01/19/2017).  Future Appointments  Date Time Provider [place]  01/19/2017  9:00 AM Gillis Santa, MD [facility] None  01/20/2017 10:35 AM Lavonia Drafts, MD [facility]    Primary Care Physician: Shelda Pal, DO Location: [facility] Note by: Gillis Santa, M.D Date: 12/25/2016; Time: 12:08 PM  Patient Instructions   -bilateral SI joint injection with sedation Mon Jan  [id number] RISKS AND COMPLICATIONS  What are the risk, side effects and possible complications? Generally speaking, most procedures are safe.  However, with any procedure there are risks, side effects, and the possibility of complications.  The risks and complications are dependent upon the sites that are lesioned, or the type of nerve block to be performed.  The closer the procedure is to the spine, the more serious the risks are.  Great care is taken when placing the radio frequency needles, block needles or lesioning probes, but sometimes complications can occur. 1. Infection: Any time there is an injection through the skin, there is a risk of infection.  This is why sterile conditions are used for these blocks.  There are four possible types of infection. 1. Localized skin infection. 2. Central Nervous System Infection-This can be in the form of Meningitis, which can be deadly. 3. Epidural Infections-This can be in the form of an epidural abscess, which can cause pressure inside of the spine, causing compression of the spinal cord with subsequent paralysis. This would require an emergency surgery to decompress, and there are no guarantees that the patient would recover from the paralysis. 4. Discitis-This is an infection of the intervertebral discs.  It occurs in about 1% of discography procedures.  It is difficult to treat and it may lead to surgery.        2. Pain: the needles have to go through skin and soft tissues, will cause soreness.       3. Damage to internal structures:  The nerves to be lesioned may be near blood vessels or    other nerves which can be potentially damaged.       4. Bleeding: Bleeding is more common if the patient is taking blood thinners such as  aspirin, Coumadin, Ticiid, Plavix, etc., or if he/she have some genetic predisposition  such as hemophilia. Bleeding into the spinal canal can cause compression of the spinal  cord with subsequent paralysis.  This would require an  emergency surgery to  decompress and there are no guarantees that the patient would recover from the  paralysis.       5. Pneumothorax:  Puncturing of a lung is a possibility, every time a needle is introduced in  the area of the chest or upper back.  Pneumothorax refers to free air around the  collapsed lung(s), inside of the thoracic cavity (chest cavity).  Another two possible  complications related to a similar event would include: Hemothorax and Chylothorax.   These are variations of the Pneumothorax, where instead of air around the collapsed  lung(s), you may have blood or chyle, respectively.       6. Spinal headaches: They may occur with any procedures in the area of the spine.       7. Persistent CSF (Cerebro-Spinal Fluid) leakage: This is a rare problem, but may occur  with prolonged intrathecal or epidural catheters either due to the formation of a fistulous  track or a dural tear.       8. Nerve damage: By working so close to the spinal cord, there is always a possibility of  nerve damage, which could be as serious as a permanent spinal cord injury with  paralysis.       9. Death:  Although rare, severe deadly allergic reactions known as "Anaphylactic  reaction" can occur to any of the medications used.      10. Worsening of the symptoms:  We can always make thing worse.  What are the chances of something like this happening? Chances of any of this occuring are extremely low.  By statistics, you have more of a chance of getting killed in a motor vehicle accident: while driving to the hospital than any of the above occurring .  Nevertheless, you should be aware that they are possibilities.  In general, it is similar to taking a shower.  Everybody knows that you can slip, hit your head and get killed.  Does that mean that you should not shower again?  Nevertheless always keep in mind that statistics do not mean anything if you happen to be on the wrong side of them.  Even if a procedure has a 1  (one) in a 1,000,000 (million) chance of going wrong, it you happen to be that one..Also, keep in mind that by statistics, you have more of a chance of having something go wrong when taking medications.  Who should not have this procedure? If you are on a blood thinning medication (e.g. Coumadin, Plavix, see list of "Blood Thinners"), or if you have an active infection going on, you should not have the procedure.  If you are taking any blood thinners, please inform your physician.  How should I prepare for this procedure?  Do not eat or drink anything at least six hours prior to the procedure.  Bring a driver with you .  It cannot be a taxi.  Come accompanied by an adult that can drive you back, and that is strong enough to help you if your legs get weak or numb from the local anesthetic.  Take all of your medicines the morning of the procedure with just enough water to swallow them.  If you have diabetes, make sure that you are scheduled to have  your procedure done first thing in the morning, whenever possible.  If you have diabetes, take only half of your insulin dose and notify our nurse that you have done so as soon as you arrive at the clinic.  If you are diabetic, but only take blood sugar pills (oral hypoglycemic), then do not take them on the morning of your procedure.  You may take them after you have had the procedure.  Do not take aspirin or any aspirin-containing medications, at least eleven (11) days prior to the procedure.  They may prolong bleeding.  Wear loose fitting clothing that may be easy to take off and that you would not mind if it got stained with Betadine or blood.  Do not wear any jewelry or perfume  Remove any nail coloring.  It will interfere with some of our monitoring equipment.  NOTE: Remember that this is not meant to be interpreted as a complete list of all possible complications.  Unforeseen problems may occur.  BLOOD THINNERS The following drugs  contain aspirin or other products, which can cause increased bleeding during surgery and should not be taken for 2 weeks prior to and 1 week after surgery.  If you should need take something for relief of minor pain, you may take acetaminophen which is found in Tylenol,m Datril, Anacin-3 and Panadol. It is not blood thinner. The products listed below are.  Do not take any of the products listed below in addition to any listed on your instruction sheet.  A.P.C or A.P.C with Codeine Codeine Phosphate Capsules #3 Ibuprofen Ridaura  ABC compound Congesprin Imuran rimadil  Advil Cope Indocin Robaxisal  Alka-Seltzer Effervescent Pain Reliever and Antacid Coricidin or Coricidin-D  Indomethacin Rufen  Alka-Seltzer plus Cold Medicine Cosprin Ketoprofen S-A-C Tablets  Anacin Analgesic Tablets or Capsules Coumadin Korlgesic Salflex  Anacin Extra Strength Analgesic tablets or capsules CP-2 Tablets Lanoril Salicylate  Anaprox Cuprimine Capsules Levenox Salocol  Anexsia-D Dalteparin Magan Salsalate  Anodynos Darvon compound Magnesium Salicylate Sine-off  Ansaid Dasin Capsules Magsal Sodium Salicylate  Anturane Depen Capsules Marnal Soma  APF Arthritis pain formula Dewitt's Pills Measurin [place]  Argesic Dia-Gesic Meclofenamic Sulfinpyrazone  Arthritis Bayer Timed Release Aspirin Diclofenac Meclomen Sulindac  Arthritis pain formula Anacin Dicumarol Medipren Supac  Analgesic (Safety coated) Arthralgen Diffunasal Mefanamic Suprofen  Arthritis Strength Bufferin Dihydrocodeine Mepro Compound Suprol  Arthropan liquid Dopirydamole Methcarbomol with Aspirin Synalgos  ASA tablets/Enseals Disalcid Micrainin Tagament  Ascriptin Doan's Midol Talwin  Ascriptin A/D Dolene Mobidin Tanderil  Ascriptin Extra Strength Dolobid Moblgesic Ticlid  Ascriptin with Codeine Doloprin or Doloprin with Codeine Momentum Tolectin  Asperbuf Duoprin Mono-gesic Trendar  Aspergum Duradyne Motrin or Motrin IB Triminicin  Aspirin  plain, buffered or enteric coated Durasal Myochrisine Trigesic  Aspirin Suppositories Easprin Nalfon Trillsate  Aspirin with Codeine Ecotrin Regular or Extra Strength Naprosyn Uracel  Atromid-S Efficin Naproxen Ursinus  Auranofin Capsules Elmiron Neocylate Vanquish  Axotal Emagrin Norgesic Verin  Azathioprine Empirin or Empirin with Codeine Normiflo Vitamin E  Azolid Emprazil Nuprin Voltaren  Bayer Aspirin plain, buffered or children's or timed BC Tablets or powders Encaprin Orgaran Warfarin Sodium  Buff-a-Comp Enoxaparin Orudis Zorpin  Buff-a-Comp with Codeine Equegesic Os-Cal-Gesic   Buffaprin Excedrin plain, buffered or Extra Strength Oxalid   Bufferin Arthritis Strength Feldene Oxphenbutazone   Bufferin plain or Extra Strength Feldene Capsules Oxycodone with Aspirin   Bufferin with Codeine Fenoprofen Fenoprofen Pabalate or Pabalate-SF   Buffets II Flogesic Panagesic   Buffinol plain or Extra Strength Florinal or Florinal  with Codeine Panwarfarin   Buf-Tabs Flurbiprofen Penicillamine   Butalbital Compound Four-way cold tablets Penicillin   Butazolidin Fragmin Pepto-Bismol   Carbenicillin Geminisyn Percodan   Carna Arthritis Reliever Geopen Persantine   Carprofen Gold's salt Persistin   Chloramphenicol Goody's Phenylbutazone   Chloromycetin Haltrain Piroxlcam   Clmetidine heparin Plaquenil   Cllnoril Hyco-pap Ponstel   Clofibrate Hydroxy chloroquine Propoxyphen         Before stopping any of these medications, be sure to consult the physician who ordered them.  Some, such as Coumadin (Warfarin) are ordered to prevent or treat serious conditions such as "deep thrombosis", "pumonary embolisms", and other heart problems.  The amount of time that you may need off of the medication may also vary with the medication and the reason for which you were taking it.  If you are taking any of these medications, please make sure you notify your pain physician before you undergo any  procedures.         Sacroiliac (SI) Joint Injection Patient Information  Description: The sacroiliac joint connects the scrum (very low back and tailbone) to the ilium (a pelvic bone which also forms half of the hip joint).  Normally this joint experiences very little motion.  When this joint becomes inflamed or unstable low back and or hip and pelvis pain may result.  Injection of this joint with local anesthetics (numbing medicines) and steroids can provide diagnostic information and reduce pain.  This injection is performed with the aid of x-ray guidance into the tailbone area while you are lying on your stomach.   You may experience an electrical sensation down the leg while this is being done.  You may also experience numbness.  We also may ask if we are reproducing your normal pain during the injection.  Conditions which may be treated SI injection:   Low back, buttock, hip or leg pain  Preparation for the Injection:  1. Do not eat any solid food or dairy products within 8 hours of your appointment.  2. You may drink clear liquids up to 3 hours before appointment.  Clear liquids include water, black coffee, juice or soda.  No milk or cream please. 3. You may take your regular medications, including pain medications with a sip of water before your appointment.  Diabetics should hold regular insulin (if take separately) and take 1/2 normal NPH dose the morning of the procedure.  Carry some sugar containing items with you to your appointment. 4. A driver must accompany you and be prepared to drive you home after your procedure. 5. Bring all of your current medications with you. 6. An IV may be inserted and sedation may be given at the discretion of the physician. 7. A blood pressure cuff, EKG and other monitors will often be applied during the procedure.  Some patients may need to have extra oxygen administered for a short period.  8. You will be asked to provide medical information,  including your allergies, prior to the procedure.  We must know immediately if you are taking blood thinners (like Coumadin/Warfarin) or if you are allergic to IV iodine contrast (dye).  We must know if you could possible be pregnant.  Possible side effects:   Bleeding from needle site  Infection (rare, may require surgery)  Nerve injury (rare)  Numbness & tingling (temporary)  A brief convulsion or seizure  Light-headedness (temporary)  Pain at injection site (several days)  Decreased blood pressure (temporary)  Weakness in the leg (temporary)  Call if you experience:   New onset weakness or numbness of an extremity below the injection site that last more than 8 hours.  Hives or difficulty breathing ( go to the emergency room)  Inflammation or drainage at the injection site  Any new symptoms which are concerning to you  Please note:  Although the local anesthetic injected can often make your back/ hip/ buttock/ leg feel good for several hours after the injections, the pain will likely return.  It takes 3-7 days for steroids to work in the sacroiliac area.  You may not notice any pain relief for at least that one week.  If effective, we will often do a series of three injections spaced 3-6 weeks apart to maximally decrease your pain.  After the initial series, we generally will wait some months before a repeat injection of the same type.  If you have any questions, please call [phone] [facility]   Patient verbalizes understanding regarding having a driver with her and nothing by mouth for 8 hours prior to procedure.

## 2017-01-14 ENCOUNTER — Encounter: Payer: Self-pay | Admitting: Family Medicine

## 2017-01-14 ENCOUNTER — Ambulatory Visit (INDEPENDENT_AMBULATORY_CARE_PROVIDER_SITE_OTHER): Payer: BLUE CROSS/BLUE SHIELD | Admitting: Family Medicine

## 2017-01-14 VITALS — BP 138/90 | HR 78 | Temp 98.3°F | Ht 72.0 in | Wt 185.0 lb

## 2017-01-14 DIAGNOSIS — M2550 Pain in unspecified joint: Secondary | ICD-10-CM

## 2017-01-14 MED ORDER — MELOXICAM 15 MG PO TABS: 15.0000 mg | ORAL_TABLET | Freq: Every day | ORAL | 2 refills | Status: DC

## 2017-01-14 MED ORDER — NORTRIPTYLINE HCL 25 MG PO CAPS: 25.0000 mg | ORAL_CAPSULE | Freq: Every day | ORAL | 2 refills | Status: DC

## 2017-01-14 MED ORDER — OXYCODONE HCL 5 MG PO TABS: 5.0000 mg | ORAL_TABLET | Freq: Three times a day (TID) | ORAL | 0 refills | Status: DC | PRN

## 2017-01-14 MED ORDER — NORETHIN ACE-ETH ESTRAD-FE 1-20 MG-MCG PO TABS: 1.0000 | ORAL_TABLET | Freq: Every day | ORAL | 11 refills | Status: DC

## 2017-01-14 NOTE — Progress Notes (Signed)
Pre visit review using our clinic review tool, if applicable. No additional management support is needed unless otherwise documented below in the visit note. 

## 2017-01-14 NOTE — Progress Notes (Signed)
Chief Complaint  Patient presents with  . Follow-up    problems standing/sitting    Subjective: Patient is a 42 y.o. female here for fibromyalgia, joint pain, menstrual pains.  Over the past several weeks, she has noticed increasing pain.  She is currently following with the pain management team.  She has been following their instructions and has seen a pain psychiatrist.  She has a follow-up nerve block this Monday and a week later has an appointment to discuss medication management.  The nerve blocks have been helpful.  She would like something to bridge the pain.  She has been on tramadol prescribed by this office.  She has been using an expired prescription of OCP that has been helpful with her pain as well.  No injury or change in activity otherwise.  ROS: MSK: +joint pain Neuro: No weakness  Family History  Problem Relation Age of Onset  . Alzheimer's disease Mother        Epo4+  . Microcephaly Father   . Heart attack Father   . Cancer Father        Prostate  . Stroke Father   . Diabetes Father   . Mental illness Father    Past Medical History:  Diagnosis Date  . Allergy   . Depression   . Eating disorder   . Fibromyalgia   . Hay fever   . History of chicken pox   . History of fainting spells of unknown cause   . Migraine   . Osteoarthritis   . Osteopenia   . Sacroiliitis (HCC)   . Scheurmann's disease    Allergies  Allergen Reactions  . Sulfa Antibiotics Anaphylaxis       . Vancomycin Anaphylaxis  . Reglan [Metoclopramide]     "it makes me feel like im crawling out of my skin"   . Terbutaline     "it makes me feel like im crawling out of my skin"     Current Outpatient Medications:  .  Diclofenac Sodium (VOLTAREN PO), Take by mouth., Disp: , Rfl:  .  dicyclomine (BENTYL) 10 MG capsule, Take 1 capsule (10 mg total) by mouth 4 (four) times daily -  before meals and at bedtime., Disp: 120 capsule, Rfl: 2 .  ergocalciferol (VITAMIN D2) 50000 units  capsule, Take 50,000 Units by mouth once a week., Disp: , Rfl:  .  gabapentin (NEURONTIN) 300 MG capsule, Take 1 capsule (300 mg total) by mouth 3 (three) times daily., Disp: 90 capsule, Rfl: 2 .  hydrOXYzine (ATARAX/VISTARIL) 50 MG tablet, TAKE 1 TABLET(50 MG) BY MOUTH THREE TIMES DAILY AS NEEDED FOR ANXIETY, Disp: 90 tablet, Rfl: 1 .  Melatonin 5 MG TABS, Take 10 tablets by mouth at bedtime., Disp: , Rfl:  .  NON FORMULARY, 1 tablet 3 (three) times daily. CBD pills, Disp: , Rfl:  .  omeprazole (PRILOSEC) 40 MG capsule, Take 1 capsule (40 mg total) by mouth daily., Disp: 30 capsule, Rfl: 2 .  oxyCODONE (OXY IR/ROXICODONE) 5 MG immediate release tablet, Take 1 tablet (5 mg total) by mouth every 8 (eight) hours as needed for severe pain., Disp: 15 tablet, Rfl: 0 .  traMADol (ULTRAM) 50 MG tablet, TAKE 2 TABLETS BY MOUTH EVERY 12 HOURS AS NEEDED FOR MODERATE PAIN, Disp: 120 tablet, Rfl: 0 .  meloxicam (MOBIC) 15 MG tablet, Take 1 tablet (15 mg total) by mouth daily., Disp: 30 tablet, Rfl: 2 .  norethindrone-ethinyl estradiol (JUNEL FE 1/20) 1-20 MG-MCG tablet, Take 1  tablet by mouth daily., Disp: 1 Package, Rfl: 11 .  nortriptyline (PAMELOR) 25 MG capsule, Take 1 capsule (25 mg total) by mouth at bedtime., Disp: 30 capsule, Rfl: 2  Objective: BP 138/90 (BP Location: Left Arm, Patient Position: Sitting, Cuff Size: Normal)   Pulse 78   Temp 98.3 F (36.8 C) (Oral)   Ht 6' (1.829 m)   Wt 185 lb (83.9 kg)   LMP 12/15/2016   SpO2 99%   BMI 25.09 kg/m  General: Awake, appears stated age MSK: walks with limp Lungs: No accessory muscle use Psych: Age appropriate judgment and insight, normal affect and mood  Assessment and Plan: Arthralgia, unspecified joint - Plan: oxyCODONE (OXY IR/ROXICODONE) 5 MG immediate release tablet, nortriptyline (PAMELOR) 25 MG capsule, meloxicam (MOBIC) 15 MG tablet  Orders as above.  Short course of oxycodone.  Try nortriptyline for fibromyalgia related pain.  Refill  meloxicam.  She understands that our narcotic prescribing will be coming to an end when she follows up with pain management. Follow-up as needed at this time. The patient voiced understanding and agreement to the plan.  Greater than 25 minutes were spent face to face with the patient with greater than 50% of this time spent counseling on FM management, opioids for chronic pain, expectations, and birth control.   [name] KetchumWendling, DO 01/14/17  12:17 PM

## 2017-01-14 NOTE — Patient Instructions (Signed)
Let me know if medicine is too expensive.  Ice/cold pack over area for 10-15 min every 2-3 hours while awake.  Heat (pad or rice pillow in microwave) over affected area, 10-15 minutes every 2-3 hours while awake.   Let us know if you need anything.

## 2017-01-19 ENCOUNTER — Encounter: Payer: Self-pay | Admitting: Student in an Organized Health Care Education/Training Program

## 2017-01-19 ENCOUNTER — Ambulatory Visit
Admission: RE | Admit: 2017-01-19 | Discharge: 2017-01-19 | Disposition: A | Discharge status: Home / Self Care | Payer: BLUE CROSS/BLUE SHIELD | Source: Ambulatory Visit | Attending: Student in an Organized Health Care Education/Training Program | Admitting: Student in an Organized Health Care Education/Training Program

## 2017-01-19 ENCOUNTER — Ambulatory Visit (HOSPITAL_BASED_OUTPATIENT_CLINIC_OR_DEPARTMENT_OTHER): Payer: BLUE CROSS/BLUE SHIELD | Admitting: Student in an Organized Health Care Education/Training Program

## 2017-01-19 VITALS — BP 110/70 | HR 73 | Temp 97.6°F | Resp 12 | Ht 72.0 in | Wt 185.0 lb

## 2017-01-19 DIAGNOSIS — Z79891 Long term (current) use of opiate analgesic: Secondary | ICD-10-CM | POA: Diagnosis not present

## 2017-01-19 DIAGNOSIS — G894 Chronic pain syndrome: Secondary | ICD-10-CM | POA: Diagnosis not present

## 2017-01-19 DIAGNOSIS — M25552 Pain in left hip: Secondary | ICD-10-CM | POA: Insufficient documentation

## 2017-01-19 DIAGNOSIS — Z888 Allergy status to other drugs, medicaments and biological substances status: Secondary | ICD-10-CM | POA: Insufficient documentation

## 2017-01-19 DIAGNOSIS — Z79899 Other long term (current) drug therapy: Secondary | ICD-10-CM | POA: Diagnosis not present

## 2017-01-19 DIAGNOSIS — M47818 Spondylosis without myelopathy or radiculopathy, sacral and sacrococcygeal region: Secondary | ICD-10-CM

## 2017-01-19 DIAGNOSIS — Z881 Allergy status to other antibiotic agents status: Secondary | ICD-10-CM | POA: Diagnosis not present

## 2017-01-19 DIAGNOSIS — M4698 Unspecified inflammatory spondylopathy, sacral and sacrococcygeal region: Secondary | ICD-10-CM | POA: Diagnosis not present

## 2017-01-19 DIAGNOSIS — M25551 Pain in right hip: Secondary | ICD-10-CM | POA: Insufficient documentation

## 2017-01-19 DIAGNOSIS — F419 Anxiety disorder, unspecified: Secondary | ICD-10-CM | POA: Insufficient documentation

## 2017-01-19 DIAGNOSIS — Z882 Allergy status to sulfonamides status: Secondary | ICD-10-CM | POA: Diagnosis not present

## 2017-01-19 DIAGNOSIS — M461 Sacroiliitis, not elsewhere classified: Secondary | ICD-10-CM

## 2017-01-19 MED ORDER — FENTANYL CITRATE (PF) 100 MCG/2ML IJ SOLN
25.0000 ug | INTRAMUSCULAR | Status: DC | PRN
  Administered 2017-01-19: 50 ug via INTRAVENOUS
  Filled 2017-01-19: qty 2

## 2017-01-19 MED ORDER — LIDOCAINE HCL (PF) 1 % IJ SOLN
10.0000 mL | Freq: Once | INTRAMUSCULAR | Status: AC
  Administered 2017-01-19: 5 mL
  Filled 2017-01-19: qty 10

## 2017-01-19 MED ORDER — DEXAMETHASONE SODIUM PHOSPHATE 10 MG/ML IJ SOLN
10.0000 mg | Freq: Once | INTRAMUSCULAR | Status: AC
  Administered 2017-01-19: 10 mg
  Filled 2017-01-19: qty 1

## 2017-01-19 MED ORDER — LACTATED RINGERS IV SOLN
1000.0000 mL | Freq: Once | INTRAVENOUS | Status: AC
  Administered 2017-01-19: 1000 mL via INTRAVENOUS

## 2017-01-19 MED ORDER — ROPIVACAINE HCL 2 MG/ML IJ SOLN
10.0000 mL | Freq: Once | INTRAMUSCULAR | Status: AC
  Administered 2017-01-19: 10 mL
  Filled 2017-01-19: qty 10

## 2017-01-19 NOTE — Progress Notes (Signed)
Safety precautions to be maintained throughout the outpatient stay will include: orient to surroundings, keep bed in low position, maintain call bell within reach at all times, provide assistance with transfer out of bed and ambulation.  

## 2017-01-19 NOTE — Patient Instructions (Signed)
Sacroiliac (SI) Joint Injection Patient Information  Description: The sacroiliac joint connects the scrum (very low back and tailbone) to the ilium (a pelvic bone which also forms half of the hip joint).  Normally this joint experiences very little motion.  When this joint becomes inflamed or unstable low back and or hip and pelvis pain may result.  Injection of this joint with local anesthetics (numbing medicines) and steroids can provide diagnostic information and reduce pain.  This injection is performed with the aid of x-ray guidance into the tailbone area while you are lying on your stomach.   You may experience an electrical sensation down the leg while this is being done.  You may also experience numbness.  We also may ask if we are reproducing your normal pain during the injection.  Conditions which may be treated SI injection:   Low back, buttock, hip or leg pain  Preparation for the Injection:  1. Do not eat any solid food or dairy products within 8 hours of your appointment.  2. You may drink clear liquids up to 3 hours before appointment.  Clear liquids include water, black coffee, juice or soda.  No milk or cream please. 3. You may take your regular medications, including pain medications with a sip of water before your appointment.  Diabetics should hold regular insulin (if take separately) and take 1/2 normal NPH dose the morning of the procedure.  Carry some sugar containing items with you to your appointment. 4. A driver must accompany you and be prepared to drive you home after your procedure. 5. Bring all of your current medications with you. 6. An IV may be inserted and sedation may be given at the discretion of the physician. 7. A blood pressure cuff, EKG and other monitors will often be applied during the procedure.  Some patients may need to have extra oxygen administered for a short period.  8. You will be asked to provide medical information, including your allergies,  prior to the procedure.  We must know immediately if you are taking blood thinners (like Coumadin/Warfarin) or if you are allergic to IV iodine contrast (dye).  We must know if you could possible be pregnant.  Possible side effects:   Bleeding from needle site  Infection (rare, may require surgery)  Nerve injury (rare)  Numbness & tingling (temporary)  A brief convulsion or seizure  Light-headedness (temporary)  Pain at injection site (several days)  Decreased blood pressure (temporary)  Weakness in the leg (temporary)   Call if you experience:   New onset weakness or numbness of an extremity below the injection site that last more than 8 hours.  Hives or difficulty breathing ( go to the emergency room)  Inflammation or drainage at the injection site  Any new symptoms which are concerning to you  Please note:  Although the local anesthetic injected can often make your back/ hip/ buttock/ leg feel good for several hours after the injections, the pain will likely return.  It takes 3-7 days for steroids to work in the sacroiliac area.  You may not notice any pain relief for at least that one week.  If effective, we will often do a series of three injections spaced 3-6 weeks apart to maximally decrease your pain.  After the initial series, we generally will wait some months before a repeat injection of the same type.  If you have any questions, please call [phone] [facility]   

## 2017-01-19 NOTE — Progress Notes (Signed)
Patient's Name: Gail Watkins  MRN: [id number]  Referring Provider: Sharlene Dory*  DOB: July 04, 1975  PCP: Sharlene Dory, DO  DOS: 01/19/2017  Note by: Edward Jolly, MD  Service setting: Ambulatory outpatient  Specialty: Interventional Pain Management  Patient type: Established  Location: [facility] (AMB) Pain Management Facility  Visit type: Interventional Procedure   Primary Reason for Visit: Interventional Pain Management Treatment. CC: Hip Pain (bilateral)  Procedure:  Anesthesia, Analgesia, Anxiolysis:  Type: Therapeutic Sacroiliac Joint Steroid Injection #2 Region: Superior Lumbosacral Region Level: PSIS (Posterior Superior Iliac Spine) Laterality: Bilateral  Type: Local Anesthesia with Moderate (Conscious) Sedation Local Anesthetic: Lidocaine 1% Route: Intravenous (IV) IV Access: Secured Sedation: Meaningful verbal contact was maintained at all times during the procedure  Indication(s): Analgesia and Anxiety  Indications: 1. SI joint arthritis (HCC)   2. Chronic pain syndrome    Pain Score: Pre-procedure: 8 /10 Post-procedure: 1 /10  Pre-op Assessment:  Ms. Hulen is a 42 y.o. (year old), female patient, seen today for interventional treatment. She  has a past surgical history that includes Cesarean section; cystectomy; Dilation and curettage of uterus; and Ovarian cyst removal. Ms. Chichester has a current medication list which includes the following prescription(s): dicyclomine, hydroxyzine, melatonin, meloxicam, norethindrone-ethinyl estradiol, nortriptyline, omeprazole, oxycodone, tramadol, diclofenac sodium, ergocalciferol, gabapentin, and NON FORMULARY, and the following Facility-Administered Medications: fentanyl. Her primarily concern today is the Hip Pain (bilateral)  42 year old female with a history of hip, groin, buttock pain secondary to SI joint arthritis (positive Patrick's bilaterally) here for bilateral SI joint injection.  Initial Vital Signs: Last  menstrual period 09/15/2016. BMI: Estimated body mass index is 25.09 kg/m as calculated from the following:   Height as of this encounter: 6' (1.829 m).   Weight as of this encounter: 185 lb (83.9 kg).  Risk Assessment: Allergies: Reviewed. She is allergic to sulfa antibiotics; vancomycin; reglan [metoclopramide]; and terbutaline.  Allergy Precautions: None required Coagulopathies: Reviewed. None identified.  Blood-thinner therapy: None at this time Active Infection(s): Reviewed. None identified. Ms. Ford is afebrile  Site Confirmation: Ms. Fogal was asked to confirm the procedure and laterality before marking the site Procedure checklist: Completed Consent: Before the procedure and under the influence of no sedative(s), amnesic(s), or anxiolytics, the patient was informed of the treatment options, risks and possible complications. To fulfill our ethical and legal obligations, as recommended by the American Medical Association's Code of Ethics, I have informed the patient of my clinical impression; the nature and purpose of the treatment or procedure; the risks, benefits, and possible complications of the intervention; the alternatives, including doing nothing; the risk(s) and benefit(s) of the alternative treatment(s) or procedure(s); and the risk(s) and benefit(s) of doing nothing. The patient was provided information about the general risks and possible complications associated with the procedure. These may include, but are not limited to: failure to achieve desired goals, infection, bleeding, organ or nerve damage, allergic reactions, paralysis, and death. In addition, the patient was informed of those risks and complications associated to the procedure, such as failure to decrease pain; infection; bleeding; organ or nerve damage with subsequent damage to sensory, motor, and/or autonomic systems, resulting in permanent pain, numbness, and/or weakness of one or several areas of the body;  allergic reactions; (i.e.: anaphylactic reaction); and/or death. Furthermore, the patient was informed of those risks and complications associated with the medications. These include, but are not limited to: allergic reactions (i.e.: anaphylactic or anaphylactoid reaction(s)); adrenal axis suppression; blood sugar elevation that in diabetics may  result in ketoacidosis or comma; water retention that in patients with history of congestive heart failure may result in shortness of breath, pulmonary edema, and decompensation with resultant heart failure; weight gain; swelling or edema; medication-induced neural toxicity; particulate matter embolism and blood vessel occlusion with resultant organ, and/or nervous system infarction; and/or aseptic necrosis of one or more joints. Finally, the patient was informed that Medicine is not an exact science; therefore, there is also the possibility of unforeseen or unpredictable risks and/or possible complications that may result in a catastrophic outcome. The patient indicated having understood very clearly. We have given the patient no guarantees and we have made no promises. Enough time was given to the patient to ask questions, all of which were answered to the patient's satisfaction. [name] has indicated that she wanted to continue with the procedure. Attestation: I, the ordering provider, attest that I have discussed with the patient the benefits, risks, side-effects, alternatives, likelihood of achieving goals, and potential problems during recovery for the procedure that I have provided informed consent. Date: 01/19/2017; Time: 9:18 AM  Pre-Procedure Preparation:  Monitoring: As per clinic protocol. Respiration, ETCO2, SpO2, BP, heart rate and rhythm monitor placed and checked for adequate function Safety Precautions: Patient was assessed for positional comfort and pressure points before starting the procedure. Time-out: I initiated and conducted the "Time-out"  before starting the procedure, as per protocol. The patient was asked to participate by confirming the accuracy of the "Time Out" information. Verification of the correct person, site, and procedure were performed and confirmed by me, the nursing staff, and the patient. "Time-out" conducted as per Joint Commission's Universal Protocol (UP.01.01.01). "Time-out" Date & Time: 01/19/2017; 0931 hrs.  Description of Procedure Process:  Position: Prone Target Area: Superior, posterior, aspect of the sacroiliac fissure Approach: Posterior, paraspinal, ipsilateral approach. Area Prepped: Entire Lower Lumbosacral Region Prepping solution: ChloraPrep (2% chlorhexidine gluconate and 70% isopropyl alcohol) Safety Precautions: Aspiration looking for blood return was conducted prior to all injections. At no point did we inject any substances, as a needle was being advanced. No attempts were made at seeking any paresthesias. Safe injection practices and needle disposal techniques used. Medications properly checked for expiration dates. SDV (single dose vial) medications used. Description of the Procedure: Protocol guidelines were followed. The patient was placed in position over the procedure table. The target area was identified and the area prepped in the usual manner. Skin & deeper tissues infiltrated with local anesthetic. Appropriate amount of time allowed to pass for local anesthetics to take effect. The procedure needle was advanced under fluoroscopic guidance into the sacroiliac joint until a firm endpoint was obtained. Proper needle placement secured. Negative aspiration confirmed. Solution injected in intermittent fashion, asking for systemic symptoms every 0.5cc of injectate. The needles were then removed and the area cleansed, making sure to leave some of the prepping solution back to take advantage of its long term bactericidal properties. Vitals:   01/19/17 0940 01/19/17 0950 01/19/17 1000 01/19/17 1010   BP: 125/74 114/68 107/67 110/70  Pulse: 73     Resp: 13 12 12 12   Temp:  98.6 F (37 C)  97.6 F (36.4 C)  TempSrc:      SpO2: 100% 99% 98% 99%  Weight:      Height:        Start Time: 0931 hrs. End Time: 0940 hrs. Materials:  Needle(s) Type: Regular needle Gauge: 22G Length: 3.5-in Medication(s): We administered lactated ringers, fentaNYL, lidocaine (PF), ropivacaine (PF) 2 mg/mL (  0.2%), and dexamethasone. Please see chart orders for dosing details. 2.5 cc intra-articular, 2.5 cc periarticular on each side. Solution made of 9 cc of 0.2% ropivacaine and 1 cc of Decadron. Imaging Guidance (Non-Spinal):  Type of Imaging Technique: Fluoroscopy Guidance (Non-Spinal) Indication(s): Assistance in needle guidance and placement for procedures requiring needle placement in or near specific anatomical locations not easily accessible without such assistance. Exposure Time: Please see nurses notes. Contrast: None used. Fluoroscopic Guidance: I was personally present during the use of fluoroscopy. "Tunnel Vision Technique" used to obtain the best possible view of the target area. Parallax error corrected before commencing the procedure. "Direction-depth-direction" technique used to introduce the needle under continuous pulsed fluoroscopy. Once target was reached, antero-posterior, oblique, and lateral fluoroscopic projection used confirm needle placement in all planes. Images permanently stored in EMR. Interpretation: I personally interpreted the imaging intraoperatively. Adequate needle placement confirmed in multiple planes. Appropriate spread of contrast into desired area was observed. No evidence of afferent or efferent intravascular uptake. Permanent images saved into the patient's record.  Antibiotic Prophylaxis:  Indication(s): None identified Antibiotic given: None  Post-operative Assessment:  EBL: None Complications: No immediate post-treatment complications observed by team, or  reported by patient. Note: The patient tolerated the entire procedure well. A repeat set of vitals were taken after the procedure and the patient was kept under observation following institutional policy, for this type of procedure. Post-procedural neurological assessment was performed, showing return to baseline, prior to discharge. The patient was provided with post-procedure discharge instructions, including a section on how to identify potential problems. Should any problems arise concerning this procedure, the patient was given instructions to immediately contact us, at any time, without hesitation. In any case, we plan to contact the patient by telephone for a follow-up status report regarding this interventional procedure. Comments:  No additional relevant information.  Plan of Care  Disposition: Discharge home  Discharge Date & Time: 01/19/2017; 1015 hrs.  Lower extremity strength unchanged from preprocedure, 5 out of 5 strength in knee flexion, knee extension, plantar flexion, dorsiflexion.   Physician-requested Follow-up:  Return in about 4 weeks (around 02/16/2017) for Post Procedure Evaluation.  Future Appointments  Date Time Provider Department Center  01/26/2017  1:55 PM Willodean Rosenthal, MD [facility]  02/12/2017  8:45 AM Edward Jolly, MD [facility] None    Imaging Orders     DG C-Arm 1-60 Min-No Report Procedure Orders    No procedure(s) ordered today    Medications ordered for procedure: Meds ordered this encounter  Medications  . lactated ringers infusion 1,000 mL  . fentaNYL (SUBLIMAZE) injection 25-100 mcg    Make sure Narcan is available in the pyxis when using this medication. In the event of respiratory depression (RR< 8/min): Titrate NARCAN (naloxone) in increments of 0.1 to 0.2 mg IV at 2-3 minute intervals, until desired degree of reversal.  . lidocaine (PF) (XYLOCAINE) 1 % injection 10 mL  . ropivacaine (PF) 2 mg/mL (0.2%) (NAROPIN) injection 10 mL  .  dexamethasone (DECADRON) injection 10 mg   Medications administered: We administered lactated ringers, fentaNYL, lidocaine (PF), ropivacaine (PF) 2 mg/mL (0.2%), and dexamethasone.  See the medical record for exact dosing, route, and time of administration.  New Prescriptions   No medications on file   Primary Care Physician: Sharlene Dory, DO Location: [facility] Note by: Edward Jolly, MD Date: 01/19/2017; Time: 11:41 AM  Disclaimer:  Medicine is not an exact science. The only guarantee in medicine is  that nothing is guaranteed. It is important to note that the decision to proceed with this intervention was based on the information collected from the patient. The Data and conclusions were drawn from the patient's questionnaire, the interview, and the physical examination. Because the information was provided in large part by the patient, it cannot be guaranteed that it has not been purposely or unconsciously manipulated. Every effort has been made to obtain as much relevant data as possible for this evaluation. It is important to note that the conclusions that lead to this procedure are derived in large part from the available data. Always take into account that the treatment will also be dependent on availability of resources and existing treatment guidelines, considered by other Pain Management Practitioners as being common knowledge and practice, at the time of the intervention. For Medico-Legal purposes, it is also important to point out that variation in procedural techniques and pharmacological choices are the acceptable norm. The indications, contraindications, technique, and results of the above procedure should only be interpreted and judged by a Board-Certified Interventional Pain Specialist with extensive familiarity and expertise in the same exact procedure and technique.

## 2017-01-20 ENCOUNTER — Encounter: Payer: Self-pay | Admitting: Family Medicine

## 2017-01-20 ENCOUNTER — Ambulatory Visit: Payer: BLUE CROSS/BLUE SHIELD | Admitting: Obstetrics & Gynecology

## 2017-01-21 MED ORDER — OMEPRAZOLE 40 MG PO CPDR: 40.0000 mg | DELAYED_RELEASE_CAPSULE | Freq: Every day | ORAL | 5 refills | Status: DC

## 2017-01-23 ENCOUNTER — Other Ambulatory Visit: Payer: Self-pay | Admitting: Family Medicine

## 2017-01-23 DIAGNOSIS — M461 Sacroiliitis, not elsewhere classified: Secondary | ICD-10-CM

## 2017-01-26 ENCOUNTER — Encounter: Payer: Self-pay | Admitting: General Practice

## 2017-01-26 ENCOUNTER — Encounter: Payer: Self-pay | Admitting: Obstetrics & Gynecology

## 2017-01-26 ENCOUNTER — Ambulatory Visit: Payer: BLUE CROSS/BLUE SHIELD | Admitting: Obstetrics & Gynecology

## 2017-02-12 ENCOUNTER — Ambulatory Visit
Payer: BLUE CROSS/BLUE SHIELD | Attending: Student in an Organized Health Care Education/Training Program | Admitting: Student in an Organized Health Care Education/Training Program

## 2017-02-12 ENCOUNTER — Encounter: Payer: Self-pay | Admitting: Student in an Organized Health Care Education/Training Program

## 2017-02-12 ENCOUNTER — Other Ambulatory Visit: Payer: Self-pay

## 2017-02-12 VITALS — BP 133/93 | HR 91 | Temp 98.3°F | Resp 16 | Ht 72.0 in | Wt 185.0 lb

## 2017-02-12 DIAGNOSIS — Z888 Allergy status to other drugs, medicaments and biological substances status: Secondary | ICD-10-CM | POA: Insufficient documentation

## 2017-02-12 DIAGNOSIS — M25551 Pain in right hip: Secondary | ICD-10-CM | POA: Diagnosis not present

## 2017-02-12 DIAGNOSIS — M2550 Pain in unspecified joint: Secondary | ICD-10-CM | POA: Diagnosis not present

## 2017-02-12 DIAGNOSIS — M25552 Pain in left hip: Secondary | ICD-10-CM | POA: Insufficient documentation

## 2017-02-12 DIAGNOSIS — Z881 Allergy status to other antibiotic agents status: Secondary | ICD-10-CM | POA: Diagnosis not present

## 2017-02-12 DIAGNOSIS — G894 Chronic pain syndrome: Secondary | ICD-10-CM | POA: Insufficient documentation

## 2017-02-12 DIAGNOSIS — M7918 Myalgia, other site: Secondary | ICD-10-CM | POA: Diagnosis not present

## 2017-02-12 DIAGNOSIS — F339 Major depressive disorder, recurrent, unspecified: Secondary | ICD-10-CM | POA: Diagnosis not present

## 2017-02-12 DIAGNOSIS — F410 Panic disorder [episodic paroxysmal anxiety] without agoraphobia: Secondary | ICD-10-CM | POA: Diagnosis not present

## 2017-02-12 DIAGNOSIS — M5124 Other intervertebral disc displacement, thoracic region: Secondary | ICD-10-CM | POA: Diagnosis not present

## 2017-02-12 DIAGNOSIS — Z5181 Encounter for therapeutic drug level monitoring: Secondary | ICD-10-CM | POA: Insufficient documentation

## 2017-02-12 DIAGNOSIS — Z882 Allergy status to sulfonamides status: Secondary | ICD-10-CM | POA: Insufficient documentation

## 2017-02-12 DIAGNOSIS — M461 Sacroiliitis, not elsewhere classified: Secondary | ICD-10-CM | POA: Insufficient documentation

## 2017-02-12 DIAGNOSIS — F4310 Post-traumatic stress disorder, unspecified: Secondary | ICD-10-CM | POA: Diagnosis not present

## 2017-02-12 DIAGNOSIS — M47818 Spondylosis without myelopathy or radiculopathy, sacral and sacrococcygeal region: Secondary | ICD-10-CM

## 2017-02-12 DIAGNOSIS — M4698 Unspecified inflammatory spondylopathy, sacral and sacrococcygeal region: Secondary | ICD-10-CM

## 2017-02-12 DIAGNOSIS — M1990 Unspecified osteoarthritis, unspecified site: Secondary | ICD-10-CM | POA: Diagnosis not present

## 2017-02-12 DIAGNOSIS — Z79899 Other long term (current) drug therapy: Secondary | ICD-10-CM | POA: Insufficient documentation

## 2017-02-12 DIAGNOSIS — R569 Unspecified convulsions: Secondary | ICD-10-CM | POA: Insufficient documentation

## 2017-02-12 DIAGNOSIS — Z87891 Personal history of nicotine dependence: Secondary | ICD-10-CM | POA: Insufficient documentation

## 2017-02-12 MED ORDER — OXYCODONE HCL 5 MG PO TABS: 5.0000 mg | ORAL_TABLET | Freq: Every day | ORAL | 0 refills | Status: DC | PRN

## 2017-02-12 MED ORDER — NORTRIPTYLINE HCL 25 MG PO CAPS: 50.0000 mg | ORAL_CAPSULE | Freq: Every day | ORAL | 2 refills | Status: DC

## 2017-02-12 NOTE — Progress Notes (Signed)
Safety precautions to be maintained throughout the outpatient stay will include: orient to surroundings, keep bed in low position, maintain call bell within reach at all times, provide assistance with transfer out of bed and ambulation.  

## 2017-02-12 NOTE — Progress Notes (Signed)
Patient's Name: Gail Watkins  MRN: [id number]  Referring Provider: Shelda Pal*  DOB: Jun 18, 1975  PCP: Shelda Pal, DO  DOS: 02/12/2017  Note by: Gillis Santa, MD  Service setting: Ambulatory outpatient  Specialty: Interventional Pain Management  Location: [facility] (AMB) Pain Management Facility    Patient type: Established   Primary Reason(s) for Visit: Encounter for prescription drug management & post-procedure evaluation of chronic illness with mild to moderate exacerbation(Level of risk: moderate) CC: Hip Pain (bilateral)  HPI  Gail Watkins is a 42 y.o. year old, female patient, who comes today for a post-procedure evaluation and medication management. 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 Her primarily concern today is the Hip Pain (bilateral)  Pain Assessment: Location: Right, Left Hip Radiating: buttocks, lower back Onset: More than a month ago Duration: Chronic pain Quality: Dull, Aching Severity: 7 /10 (self-reported pain score)  Note: Reported level is inconsistent with clinical observations.                         When using our objective Pain Scale, levels between 6 and 10/10 are said to belong in an emergency room, as it progressively worsens from a 6/10, described as severely limiting, requiring emergency care not usually available at an outpatient pain management facility. At a 6/10 level, communication becomes difficult and requires great effort. Assistance to reach the emergency department may be required. Facial flushing and profuse sweating along with potentially dangerous increases in heart rate and blood pressure will be evident. Effect on ADL:   Timing: Constant Modifying factors: rest, heat, massage  Gail Watkins was last seen on 01/19/2017 for a procedure. During today's appointment we reviewed Gail Watkins's post-procedure results, as well as her outpatient medication  regimen.  Further details on both, my assessment(s), as well as the proposed treatment plan, please see below.  Controlled Substance Pharmacotherapy Assessment REMS (Risk Evaluation and Mitigation Strategy)  Analgesic: Previously not on opioid medications prescribed by me.  Will start oxycodone 5 mg daily as needed, quantity 30 a month today. MME/day: <10 mg/day.  Landis Martins, RN  02/12/2017  8:50 AM  Sign at close encounter Safety precautions to be maintained throughout the outpatient stay will include: orient to surroundings, keep bed in low position, maintain call bell within reach at all times, provide assistance with transfer out of bed and ambulation.    Pharmacokinetics: Liberation and absorption (onset of action): WNL Distribution (time to peak effect): WNL Metabolism and excretion (duration of action): WNL         Pharmacodynamics: Desired effects: Analgesia: Gail Watkins reports >50% benefit. Functional ability: Patient reports that medication allows her to accomplish basic ADLs Clinically meaningful improvement in function (CMIF): Sustained CMIF goals met Perceived effectiveness: Described as relatively effective, allowing for increase in activities of daily living (ADL) Undesirable effects: Side-effects or Adverse reactions: None reported Monitoring: [place] PMP: Online review of the past 43-monthperiod conducted. Compliant with practice rules and regulations Last UDS on record: Summary  Date Value Ref Range Status  09/29/2016 FINAL  Final    Comment:    ==================================================================== TOXASSURE SELECT 13 (MW) ==================================================================== Test                             Result       Flag       Units Drug  Present and Declared for Prescription Verification   Tramadol                       >4065        EXPECTED   ng/mg creat   O-Desmethyltramadol            >4065        EXPECTED   ng/mg creat    N-Desmethyltramadol            >4065        EXPECTED   ng/mg creat    Source of tramadol is a prescription medication.    O-desmethyltramadol and N-desmethyltramadol are expected    metabolites of tramadol. Drug Present not Declared for Prescription Verification   Desmethyldiazepam              172          UNEXPECTED ng/mg creat   Oxazepam                       193          UNEXPECTED ng/mg creat   Temazepam                      245          UNEXPECTED ng/mg creat    Desmethyldiazepam, oxazepam, and temazepam are benzodiazepine    drugs, but may also be present as common metabolites of other    benzodiazepine drugs, including diazepam.   7-aminoclonazepam              212          UNEXPECTED ng/mg creat    7-aminoclonazepam is an expected metabolite of clonazepam. Source    of clonazepam is a scheduled prescription medication.   Oxycodone                      2311         UNEXPECTED ng/mg creat   Oxymorphone                    2159         UNEXPECTED ng/mg creat   Noroxycodone                   3497         UNEXPECTED ng/mg creat   Noroxymorphone                 648          UNEXPECTED ng/mg creat    Sources of oxycodone are scheduled prescription medications.    Oxymorphone, noroxycodone, and noroxymorphone are expected    metabolites of oxycodone. Oxymorphone is also available as a    scheduled prescription medication. ==================================================================== Test                      Result    Flag   Units      Ref Range   Creatinine              123              mg/dL      >=20 ==================================================================== Declared Medications:  The flagging and interpretation on this report are based on the  following declared medications.  Unexpected results may arise from  inaccuracies in the declared medications.  **Note: The testing scope of this panel  includes these medications:  Tramadol  **Note: The testing scope of this  panel does not include following  reported medications:  Diclofenac  Dicyclomine  Gabapentin  Hydroxyzine  Melatonin  Meloxicam  Omeprazole  Vitamin D2 (Ergocalciferol) ==================================================================== For clinical consultation, please call [phone]. ====================================================================    UDS interpretation: Non-Compliant Patient reminded of the CDC guidelines recommending to stay away from the sedatives & benzodiazepines due to the risk of respiratory depression and death. Medication Assessment Form: Will have patient sign opiate agreement today.  Discussed no benzodiazepines while on opioid therapy.  Patient does utilize CBD for PTSD.  She denies smoking marijuana. Treatment compliance: Deficiencies noted and steps taken to remind the patient of the seriousness of adequate therapy compliance Risk Assessment Profile: Aberrant behavior: See prior evaluations. None observed or detected today Comorbid factors increasing risk of overdose: See prior notes. No additional risks detected today Risk of substance use disorder (SUD): Low Opioid Risk Tool - 12/25/16 1030      Family History of Substance Abuse   Alcohol  Negative    Illegal Drugs  Negative    Rx Drugs  Negative      Personal History of Substance Abuse   Alcohol  Negative    Illegal Drugs  Negative    Rx Drugs  Negative      Age   Age between 28-45 years   Yes      History of Preadolescent Sexual Abuse   History of Preadolescent Sexual Abuse  Negative or Female      Psychological Disease   Psychological Disease  Positive    Depression  Positive      Total Score   Opioid Risk Tool Scoring  4    Opioid Risk Interpretation  Moderate Risk      ORT Scoring interpretation table:  Score <3 = Low Risk for SUD  Score between 4-7 = Moderate Risk for SUD  Score >8 = High Risk for Opioid Abuse   Risk Mitigation Strategies:  Patient Counseling:  Completed today. Counseling provided to patient as per "Patient Counseling Document". Document signed by patient, attesting to counseling and understanding Patient-Prescriber Agreement (PPA): Obtained today  Notification to other healthcare providers: Done  Pharmacologic Plan: Oxycodone 5 mg daily as needed, quantity 30 a month.             Post-Procedure Assessment  01/19/2017 Procedure: Bilateral SI joint injection Pre-procedure pain score:  8/10 Post-procedure pain score: 1/10         Influential Factors: BMI: 25.09 kg/m Intra-procedural challenges: None observed.         Assessment challenges: None detected.              Reported side-effects: None.        Post-procedural adverse reactions or complications: None reported         Sedation: Please see nurses note. When no sedatives are used, the analgesic levels obtained are directly associated to the effectiveness of the local anesthetics. However, when sedation is provided, the level of analgesia obtained during the initial 1 hour following the intervention, is believed to be the result of a combination of factors. These factors may include, but are not limited to: 1. The effectiveness of the local anesthetics used. 2. The effects of the analgesic(s) and/or anxiolytic(s) used. 3. The degree of discomfort experienced by the patient at the time of the procedure. 4. The patients ability and reliability in recalling and recording the events. 5. The presence and influence  of possible secondary gains and/or psychosocial factors. Reported result: Relief experienced during the 1st hour after the procedure: 60 % (Ultra-Short Term Relief)            Interpretative annotation: Clinically appropriate result. Analgesia during this period is likely to be Local Anesthetic and/or IV Sedative (Analgesic/Anxiolytic) related.          Effects of local anesthetic: The analgesic effects attained during this period are directly associated to the localized  infiltration of local anesthetics and therefore cary significant diagnostic value as to the etiological location, or anatomical origin, of the pain. Expected duration of relief is directly dependent on the pharmacodynamics of the local anesthetic used. Long-acting (4-6 hours) anesthetics used.  Reported result: Relief during the next 4 to 6 hour after the procedure: 60 % (Short-Term Relief)            Interpretative annotation: Clinically appropriate result. Analgesia during this period is likely to be Local Anesthetic-related.          Long-term benefit: Defined as the period of time past the expected duration of local anesthetics (1 hour for short-acting and 4-6 hours for long-acting). With the possible exception of prolonged sympathetic blockade from the local anesthetics, benefits during this period are typically attributed to, or associated with, other factors such as analgesic sensory neuropraxia, antiinflammatory effects, or beneficial biochemical changes provided by agents other than the local anesthetics.  Reported result: Extended relief following procedure: 70 % (Long-Term Relief)            Interpretative annotation: Clinically appropriate result. Good relief. No permanent benefit expected. Inflammation plays a part in the etiology to the pain.          Current benefits: Defined as reported results that persistent at this point in time.   Analgesia: 50-75 %            Function: Somewhat improved ROM: Somewhat improved Interpretative annotation: Recurrence of symptoms. Therapeutic benefit observed. Effective diagnostic intervention.          Interpretation: Results would suggest a successful diagnostic intervention. We'll proceed with the next treatment, as soon as convenient          Plan:  Please see "Plan of Care" for details.        Laboratory Chemistry  Inflammation Markers (CRP: Acute Phase) (ESR: Chronic Phase) No results found for: CRP, ESRSEDRATE, LATICACIDVEN                Rheumatology Markers No results found for: RF, ANA, LABURIC, URICUR, LYMEIGGIGMAB, LYMEABIGMQN              Renal Function Markers No results found for: BUN, CREATININE, GFRAA, GFRNONAA               Hepatic Function Markers No results found for: AST, ALT, ALBUMIN, ALKPHOS, HCVAB, AMYLASE, LIPASE, AMMONIA               Electrolytes No results found for: NA, K, CL, CALCIUM, MG, PHOS               Neuropathy Markers No results found for: VITAMINB12, FOLATE, HGBA1C, HIV               Bone Pathology Markers No results found for: VD25OH, [id number], WU1324MW1, [id number], 25OHVITD1, 25OHVITD2, 25OHVITD3, TESTOFREE, TESTOSTERONE               Coagulation Parameters Lab Results  Component Value Date   INR 1.0 12/29/2006   LABPROT  13.1 12/29/2006   APTT 30 12/29/2006   PLT 152 12/31/2006                 Cardiovascular Markers Lab Results  Component Value Date   HGB 8.9 DELTA CHECK NOTED (L) 12/31/2006   HCT 26.5 (L) 12/31/2006                 CA Markers No results found for: CEA, CA125, LABCA2               Note: Lab results reviewed.  Recent Diagnostic Imaging Results  DG C-Arm 1-60 Min-No Report Fluoroscopy was utilized by the requesting physician.  No radiographic  interpretation.   Complexity Note: Imaging results reviewed. Results shared with Gail Watkins, using Layman's terms.                         Meds   Current Outpatient Medications:  .  dicyclomine (BENTYL) 10 MG capsule, Take 1 capsule (10 mg total) by mouth 4 (four) times daily -  before meals and at bedtime., Disp: 120 capsule, Rfl: 2 .  hydrOXYzine (ATARAX/VISTARIL) 50 MG tablet, TAKE 1 TABLET(50 MG) BY MOUTH THREE TIMES DAILY AS NEEDED FOR ANXIETY, Disp: 90 tablet, Rfl: 1 .  Melatonin 5 MG TABS, Take 10 tablets by mouth at bedtime., Disp: , Rfl:  .  meloxicam (MOBIC) 15 MG tablet, Take 1 tablet (15 mg total) by mouth daily., Disp: 30 tablet, Rfl: 2 .  NON FORMULARY, 1 tablet 3 (three) times daily. CBD  pills, Disp: , Rfl:  .  norethindrone-ethinyl estradiol (JUNEL FE 1/20) 1-20 MG-MCG tablet, Take 1 tablet by mouth daily., Disp: 1 Package, Rfl: 11 .  nortriptyline (PAMELOR) 25 MG capsule, Take 2 capsules (50 mg total) by mouth at bedtime., Disp: 30 capsule, Rfl: 2 .  omeprazole (PRILOSEC) 40 MG capsule, Take 1 capsule (40 mg total) by mouth daily., Disp: 30 capsule, Rfl: 5 .  oxyCODONE (OXY IR/ROXICODONE) 5 MG immediate release tablet, Take 1 tablet (5 mg total) by mouth daily as needed for severe pain. For chronic pain To fill on or after: 02/12/17, 03/14/17, Disp: 30 tablet, Rfl: 0 .  traMADol (ULTRAM) 50 MG tablet, TAKE 2 TABLETS BY MOUTH EVERY 12 HOURS AS NEEDED FOR MODERATE PAIN, Disp: 120 tablet, Rfl: 0 .  Diclofenac Sodium (VOLTAREN PO), Take by mouth., Disp: , Rfl:  .  ergocalciferol (VITAMIN D2) 50000 units capsule, Take 50,000 Units by mouth once a week., Disp: , Rfl:  .  gabapentin (NEURONTIN) 300 MG capsule, Take 1 capsule (300 mg total) by mouth 3 (three) times daily. (Patient not taking: Reported on 01/19/2017), Disp: 90 capsule, Rfl: 2  ROS  Constitutional: Denies any fever or chills Gastrointestinal: No reported hemesis, hematochezia, vomiting, or acute GI distress Musculoskeletal: Denies any acute onset joint swelling, redness, loss of ROM, or weakness Neurological: No reported episodes of acute onset apraxia, aphasia, dysarthria, agnosia, amnesia, paralysis, loss of coordination, or loss of consciousness  Allergies  Gail Watkins is allergic to sulfa antibiotics; vancomycin; reglan [metoclopramide]; and terbutaline.  Gail Watkins  Drug: Gail Watkins  reports that she does not use drugs. Alcohol:  reports that she drinks alcohol. Tobacco:  reports that she has quit smoking. Her smoking use included cigarettes. She started smoking about 25 years ago. she has never used smokeless tobacco. Medical:  has a past medical history of Allergy, Depression, Eating disorder, Fibromyalgia, Hay fever,  History of chicken pox, History  of fainting spells of unknown cause, Migraine, Osteoarthritis, Osteopenia, Sacroiliitis ([place]), and Scheurmann's disease. Surgical: Gail Watkins  has a past surgical history that includes Cesarean section; cystectomy; Dilation and curettage of uterus; and Ovarian cyst removal. Family: family history includes Alzheimer's disease in her mother; Cancer in her father; Diabetes in her father; Heart attack in her father; Mental illness in her father; Microcephaly in her father; Stroke in her father.  Constitutional Exam  General appearance: Well nourished, well developed, and well hydrated. In no apparent acute distress Vitals:   02/12/17 0842  BP: (!) 133/93  Pulse: 91  Resp: 16  Temp: 98.3 F (36.8 C)  TempSrc: Oral  SpO2: 100%  Weight: 185 lb (83.9 kg)  Height: 6' (1.829 m)   BMI Assessment: Estimated body mass index is 25.09 kg/m as calculated from the following:   Height as of this encounter: 6' (1.829 m).   Weight as of this encounter: 185 lb (83.9 kg).  BMI interpretation table: BMI level Category Range association with higher incidence of chronic pain  <18 kg/m2 Underweight   18.5-24.9 kg/m2 Ideal body weight   25-29.9 kg/m2 Overweight Increased incidence by 20%  30-34.9 kg/m2 Obese (Class I) Increased incidence by 68%  35-39.9 kg/m2 Severe obesity (Class II) Increased incidence by 136%  >40 kg/m2 Extreme obesity (Class III) Increased incidence by 254%   BMI Readings from Last 4 Encounters:  02/12/17 25.09 kg/m  01/19/17 25.09 kg/m  01/14/17 25.09 kg/m  12/25/16 25.09 kg/m   Wt Readings from Last 4 Encounters:  02/12/17 185 lb (83.9 kg)  01/19/17 185 lb (83.9 kg)  01/14/17 185 lb (83.9 kg)  12/25/16 185 lb (83.9 kg)  Psych/Mental status: Alert, oriented x 3 (person, place, & time)       Eyes: PERLA Respiratory: No evidence of acute respiratory distress  Cervical Spine Area Exam  Skin & Axial Inspection: No masses, redness, edema,  swelling, or associated skin lesions Alignment: Symmetrical Functional ROM: Unrestricted ROM      Stability: No instability detected Muscle Tone/Strength: Functionally intact. No obvious neuro-muscular anomalies detected. Sensory (Neurological): Unimpaired Palpation: No palpable anomalies              Upper Extremity (UE) Exam    Side: Right upper extremity  Side: Left upper extremity  Skin & Extremity Inspection: Skin color, temperature, and hair growth are WNL. No peripheral edema or cyanosis. No masses, redness, swelling, asymmetry, or associated skin lesions. No contractures.  Skin & Extremity Inspection: Skin color, temperature, and hair growth are WNL. No peripheral edema or cyanosis. No masses, redness, swelling, asymmetry, or associated skin lesions. No contractures.  Functional ROM: Unrestricted ROM          Functional ROM: Unrestricted ROM          Muscle Tone/Strength: Functionally intact. No obvious neuro-muscular anomalies detected.  Muscle Tone/Strength: Functionally intact. No obvious neuro-muscular anomalies detected.  Sensory (Neurological): Unimpaired          Sensory (Neurological): Unimpaired          Palpation: No palpable anomalies              Palpation: No palpable anomalies              Specialized Test(s): Deferred         Specialized Test(s): Deferred          Thoracic Spine Area Exam  Skin & Axial Inspection: No masses, redness, or swelling Alignment: Symmetrical Functional ROM: Unrestricted ROM  Stability: No instability detected Muscle Tone/Strength: Functionally intact. No obvious neuro-muscular anomalies detected. Sensory (Neurological): Unimpaired Muscle strength & Tone: No palpable anomalies  Lumbar Spine Area Exam  Skin & Axial Inspection: No masses, redness, or swelling Alignment: Symmetrical Functional ROM: Unrestricted ROM      Stability: No instability detected Muscle Tone/Strength: Functionally intact. No obvious neuro-muscular anomalies  detected. Sensory (Neurological): Unimpaired Palpation: No palpable anomalies       Provocative Tests: Lumbar Hyperextension and rotation test: Positive bilaterally for facet joint pain. L>R Lumbar Lateral bending test: Positive ipsilateral radicular pain, on the right. Positive for right-sided foraminal stenosis. Patrick's Maneuver: Positive for bilateral S-I arthralgia              Gait & Posture Assessment  Ambulation: Unassisted Gait: Relatively normal for age and body habitus Posture: WNL   Lower Extremity Exam    Side: Right lower extremity  Side: Left lower extremity  Skin & Extremity Inspection: Skin color, temperature, and hair growth are WNL. No peripheral edema or cyanosis. No masses, redness, swelling, asymmetry, or associated skin lesions. No contractures.  Skin & Extremity Inspection: Skin color, temperature, and hair growth are WNL. No peripheral edema or cyanosis. No masses, redness, swelling, asymmetry, or associated skin lesions. No contractures.  Functional ROM: Unrestricted ROM          Functional ROM: Unrestricted ROM          Muscle Tone/Strength: Functionally intact. No obvious neuro-muscular anomalies detected.  Muscle Tone/Strength: Functionally intact. No obvious neuro-muscular anomalies detected.  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 Diagnoses of Chronic pain syndrome, Recurrent major depressive disorder, remission status unspecified ([place]), PTSD (post-traumatic stress disorder), Myofascial pain, and Arthralgia, unspecified joint were also pertinent to this visit.  Status Diagnosis  Responding Controlled Controlled 1. 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 Chronic pain syndrome   3. Recurrent major depressive disorder, remission status unspecified  ([place])   4. PTSD (post-traumatic stress disorder)   5. Myofascial pain   6. Arthralgia, unspecified joint      42 year old female with a history of axial low back pain left greater than right, left hip and SI joint pain that radiates into her left leg. 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 Patient does have a positive Patrick's on the left suggesting SI joint dysfunction. Patient's lumbar spine MRI is unremarkable. Patient's thoracic MRI from 2005 shows very minor right posterior lateral disc protrusion at T7-T8 level at the T10-T11 level without nerve root compression. I do not believe this to be causing the majority of her back pain. Furthermore the patient's x-rays of her sacrum and coccyx were largely unremarkable although the patient does have pain with palpation overlying her inferior portion of PSIS along with positive Patrick's on left greater than right.  Patient returns for follow-up status post bilateral SI joint injections on January 19, 2017.  Patient states that the injections were effective for her buttock and groin pain.  She is interested in repeating these injections.  She is still obtaining ongoing benefit however it has reduced over the last couple of weeks.  Otherwise, the patient's primary care physician prefers opioid management to come from the pain management clinic.  I will obtain a repeat urine drug screen today.  This should be negative for benzodiazepines.  I also talked to the patient about our clinic policy that she cannot be on any concomitant benzodiazepines if she is on opioid medications.    Plan: -UDS today.  This should be negative for benzodiazepines -Sign opioid contract. -Prescription for oxycodone below 5 mg daily as needed for severe pain, quantity 30 a month. -Continue sacral stretching exercises that she has learned. -Scheduled for bilateral SI joint  injections in 3 weeks without sedation -Refill of nortriptyline as below.  Plan of Care  Pharmacotherapy (Medications Ordered): Meds ordered this encounter  Medications  . DISCONTD: oxyCODONE (OXY IR/ROXICODONE) 5 MG immediate release tablet    Sig: Take 1 tablet (5 mg total) by mouth daily as needed for severe pain. For chronic pain To fill on or after: 02/12/17, 03/14/17    Dispense:  30 tablet    Refill:  0  . nortriptyline (PAMELOR) 25 MG capsule    Sig: Take 2 capsules (50 mg total) by mouth at bedtime.    Dispense:  30 capsule    Refill:  2  . oxyCODONE (OXY IR/ROXICODONE) 5 MG immediate release tablet    Sig: Take 1 tablet (5 mg total) by mouth daily as needed for severe pain. For chronic pain To fill on or after: 02/12/17, 03/14/17    Dispense:  30 tablet    Refill:  0   Lab-work, procedure(s), and/or referral(s): Orders Placed This Encounter  Procedures  . SACROILIAC JOINT INJECTION  . ToxASSURE Select 13 (MW), Urine    Time Note: Greater than 50% of the 25 minute(s) of face-to-face time spent with Gail Watkins, was spent in counseling/coordination of care regarding: Gail Watkins primary cause of pain, the treatment plan, treatment alternatives, the risks and possible complications of proposed treatment, medication side effects, going over the informed consent, the opioid analgesic risks and possible complications, the results, interpretation and significance of  her recent diagnostic interventional treatment(s), the appropriate use of her medications and the medication agreement.  Provider-requested follow-up: No Follow-up on file.  Future Appointments  Date Time Provider [place]  03/02/2017  8:45 AM Gillis Santa, MD [facility] None  03/31/2017  9:35 AM Lavonia Drafts, MD [facility]  04/07/2017  8:30 AM Gillis Santa, MD [facility] None    Primary Care Physician: Shelda Pal, DO Location: [facility] Facility Note by: Gillis Santa, M.D Date: 02/12/2017; Time: 12:40 PM  Patient Instructions  You were given 2 prescriptions for Oxycodone today.

## 2017-02-12 NOTE — Patient Instructions (Signed)
You were given 2 prescriptions for Oxycodone today. 

## 2017-02-13 ENCOUNTER — Encounter: Payer: Self-pay | Admitting: Student in an Organized Health Care Education/Training Program

## 2017-02-17 ENCOUNTER — Encounter: Payer: Self-pay | Admitting: Student in an Organized Health Care Education/Training Program

## 2017-02-19 LAB — TOXASSURE SELECT 13 (MW), URINE

## 2017-02-20 ENCOUNTER — Other Ambulatory Visit: Payer: Self-pay | Admitting: Family Medicine

## 2017-02-20 ENCOUNTER — Ambulatory Visit: Payer: Self-pay | Admitting: Family Medicine

## 2017-02-20 DIAGNOSIS — M461 Sacroiliitis, not elsewhere classified: Secondary | ICD-10-CM

## 2017-02-20 DIAGNOSIS — Z0289 Encounter for other administrative examinations: Secondary | ICD-10-CM

## 2017-02-20 NOTE — Telephone Encounter (Signed)
Let pt know that I cannot refill this medicine as it would violate her contract with the pain clinic. There are similar alternatives that would not affect her contract that we could try if she is interested. TY.

## 2017-02-23 ENCOUNTER — Encounter: Payer: Self-pay | Admitting: Family Medicine

## 2017-02-23 ENCOUNTER — Other Ambulatory Visit: Payer: Self-pay | Admitting: Family Medicine

## 2017-02-23 DIAGNOSIS — M461 Sacroiliitis, not elsewhere classified: Secondary | ICD-10-CM

## 2017-02-23 MED ORDER — TRAMADOL HCL 50 MG PO TABS: 100.0000 mg | ORAL_TABLET | Freq: Two times a day (BID) | ORAL | 2 refills | Status: DC | PRN

## 2017-02-23 NOTE — Progress Notes (Signed)
Tramadol reordered. I asked pt to ck with pain clinic to ensure that my rx will not violate pain contract and to do this before filling.

## 2017-03-02 ENCOUNTER — Ambulatory Visit: Payer: BLUE CROSS/BLUE SHIELD | Admitting: Student in an Organized Health Care Education/Training Program

## 2017-03-06 ENCOUNTER — Ambulatory Visit (INDEPENDENT_AMBULATORY_CARE_PROVIDER_SITE_OTHER): Payer: BLUE CROSS/BLUE SHIELD | Admitting: Family Medicine

## 2017-03-06 ENCOUNTER — Encounter: Payer: Self-pay | Admitting: Family Medicine

## 2017-03-06 VITALS — BP 120/80 | HR 69 | Temp 98.2°F | Ht 72.0 in | Wt 194.4 lb

## 2017-03-06 DIAGNOSIS — J342 Deviated nasal septum: Secondary | ICD-10-CM | POA: Diagnosis not present

## 2017-03-06 DIAGNOSIS — J0100 Acute maxillary sinusitis, unspecified: Secondary | ICD-10-CM | POA: Diagnosis not present

## 2017-03-06 DIAGNOSIS — F4310 Post-traumatic stress disorder, unspecified: Secondary | ICD-10-CM | POA: Diagnosis not present

## 2017-03-06 MED ORDER — AMOXICILLIN-POT CLAVULANATE 875-125 MG PO TABS: 1.0000 | ORAL_TABLET | Freq: Two times a day (BID) | ORAL | 0 refills | Status: DC

## 2017-03-06 NOTE — Patient Instructions (Addendum)
Please consider counseling. Contact [phone] to schedule an appointment or inquire about cost/insurance coverage.  If you do not hear anything about your referrals in the next 1-2 weeks, call our office and ask for an update.  Continue to push fluids, practice good hand hygiene, and cover your mouth if you cough.  If you start having fevers, shaking or shortness of breath, seek immediate care.  For symptoms, consider using Vick's VapoRub on chest or under nose, air humidifier, Benadryl at night, and elevating the head of the bed.   Let us know if you need anything.
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
## 2017-03-06 NOTE — Progress Notes (Signed)
Pre visit review using our clinic review tool, if applicable. No additional management support is needed unless otherwise documented below in the visit note. 

## 2017-03-09 ENCOUNTER — Ambulatory Visit (HOSPITAL_BASED_OUTPATIENT_CLINIC_OR_DEPARTMENT_OTHER): Payer: BLUE CROSS/BLUE SHIELD | Admitting: Student in an Organized Health Care Education/Training Program

## 2017-03-09 ENCOUNTER — Ambulatory Visit
Admission: RE | Admit: 2017-03-09 | Discharge: 2017-03-09 | Disposition: A | Discharge status: Home / Self Care | Payer: BLUE CROSS/BLUE SHIELD | Source: Ambulatory Visit | Attending: Student in an Organized Health Care Education/Training Program | Admitting: Student in an Organized Health Care Education/Training Program

## 2017-03-09 ENCOUNTER — Other Ambulatory Visit: Payer: Self-pay

## 2017-03-09 ENCOUNTER — Encounter: Payer: Self-pay | Admitting: Student in an Organized Health Care Education/Training Program

## 2017-03-09 VITALS — BP 161/95 | HR 99 | Temp 98.4°F | Resp 14 | Ht 72.0 in | Wt 195.0 lb

## 2017-03-09 DIAGNOSIS — Z881 Allergy status to other antibiotic agents status: Secondary | ICD-10-CM | POA: Insufficient documentation

## 2017-03-09 DIAGNOSIS — Z79891 Long term (current) use of opiate analgesic: Secondary | ICD-10-CM | POA: Insufficient documentation

## 2017-03-09 DIAGNOSIS — Z79899 Other long term (current) drug therapy: Secondary | ICD-10-CM | POA: Insufficient documentation

## 2017-03-09 DIAGNOSIS — G894 Chronic pain syndrome: Secondary | ICD-10-CM | POA: Diagnosis not present

## 2017-03-09 DIAGNOSIS — Z888 Allergy status to other drugs, medicaments and biological substances status: Secondary | ICD-10-CM | POA: Insufficient documentation

## 2017-03-09 DIAGNOSIS — F419 Anxiety disorder, unspecified: Secondary | ICD-10-CM | POA: Insufficient documentation

## 2017-03-09 DIAGNOSIS — Z791 Long term (current) use of non-steroidal anti-inflammatories (NSAID): Secondary | ICD-10-CM | POA: Insufficient documentation

## 2017-03-09 DIAGNOSIS — Z882 Allergy status to sulfonamides status: Secondary | ICD-10-CM | POA: Insufficient documentation

## 2017-03-09 DIAGNOSIS — M545 Low back pain: Secondary | ICD-10-CM | POA: Diagnosis not present

## 2017-03-09 DIAGNOSIS — M47818 Spondylosis without myelopathy or radiculopathy, sacral and sacrococcygeal region: Secondary | ICD-10-CM | POA: Insufficient documentation

## 2017-03-09 DIAGNOSIS — M4698 Unspecified inflammatory spondylopathy, sacral and sacrococcygeal region: Secondary | ICD-10-CM

## 2017-03-09 MED ORDER — DEXAMETHASONE SODIUM PHOSPHATE 10 MG/ML IJ SOLN
10.0000 mg | Freq: Once | INTRAMUSCULAR | Status: AC
  Administered 2017-03-09: 10 mg
  Filled 2017-03-09: qty 1

## 2017-03-09 MED ORDER — LIDOCAINE HCL (PF) 1 % IJ SOLN
10.0000 mL | Freq: Once | INTRAMUSCULAR | Status: AC
  Administered 2017-03-09: 5 mL
  Filled 2017-03-09: qty 10

## 2017-03-09 MED ORDER — ROPIVACAINE HCL 2 MG/ML IJ SOLN
10.0000 mL | Freq: Once | INTRAMUSCULAR | Status: AC
  Administered 2017-03-09: 10 mL
  Filled 2017-03-09: qty 10

## 2017-03-09 MED ORDER — FENTANYL CITRATE (PF) 100 MCG/2ML IJ SOLN
25.0000 ug | INTRAMUSCULAR | Status: DC | PRN
  Administered 2017-03-09: 25 ug via INTRAVENOUS
  Filled 2017-03-09: qty 2

## 2017-03-09 MED ORDER — LACTATED RINGERS IV SOLN
1000.0000 mL | Freq: Once | INTRAVENOUS | Status: AC
  Administered 2017-03-09: 1000 mL via INTRAVENOUS

## 2017-03-09 NOTE — Patient Instructions (Signed)

## 2017-03-09 NOTE — Progress Notes (Signed)
Safety precautions to be maintained throughout the outpatient stay will include: orient to surroundings, keep bed in low position, maintain call bell within reach at all times, provide assistance with transfer out of bed and ambulation.  

## 2017-03-09 NOTE — Progress Notes (Signed)
Patient's Name: Gail Watkins  MRN: [id number]  Referring Provider: Edward Jolly, MD  DOB: 03-12-1975  PCP: Sharlene Dory, DO  DOS: 03/09/2017  Note by: Edward Jolly, MD  Service setting: Ambulatory outpatient  Specialty: Interventional Pain Management  Patient type: Established  Location: [facility] (AMB) Pain Management Facility  Visit type: Interventional Procedure   Primary Reason for Visit: Interventional Pain Management Treatment. CC: Back Pain (left, lower)  Procedure:  Anesthesia, Analgesia, Anxiolysis:  Type: Therapeutic Sacroiliac Joint Steroid Injection          Region: Inferior Lumbosacral Region Level: PIIS (Posterior Inferior Iliac Spine) Laterality: Bilateral  Type: Local Anesthesia with Moderate (Conscious) Sedation Local Anesthetic: Lidocaine 1% Route: Intravenous (IV) IV Access: Secured Sedation: Meaningful verbal contact was maintained at all times during the procedure  Indication(s): Analgesia and Anxiety   Indications: 1. SI joint arthritis (HCC)   2. Chronic pain syndrome    Pain Score: Pre-procedure: 6 /10 Post-procedure: 0-No pain/10  Pre-op Assessment:  Gail Watkins is a 42 y.o. (year old), female patient, seen today for interventional treatment. She  has a past surgical history that includes Cesarean section; cystectomy; Dilation and curettage of uterus; and Ovarian cyst removal. Gail Watkins has a current medication list which includes the following prescription(s): amoxicillin-clavulanate, dicyclomine, ergocalciferol, hydroxyzine, melatonin, meloxicam, NON FORMULARY, norethindrone-ethinyl estradiol, nortriptyline, omeprazole, oxycodone, tramadol, diclofenac sodium, and gabapentin, and the following Facility-Administered Medications: fentanyl and lactated ringers. Her primarily concern today is the Back Pain (left, lower)  Initial Vital Signs:  Pulse Rate: (!) 102 Temp: 98.4 F (36.9 C) Resp: 18 BP: (!) 168/83 SpO2: 100 %  BMI: Estimated body mass  index is 26.45 kg/m as calculated from the following:   Height as of this encounter: 6' (1.829 m).   Weight as of this encounter: 195 lb (88.5 kg).  Risk Assessment: Allergies: Reviewed. She is allergic to sulfa antibiotics; vancomycin; reglan [metoclopramide]; and terbutaline.  Allergy Precautions: None required Coagulopathies: Reviewed. None identified.  Blood-thinner therapy: None at this time Active Infection(s): Reviewed. None identified. Gail Watkins is afebrile  Site Confirmation: Gail Watkins was asked to confirm the procedure and laterality before marking the site Procedure checklist: Completed Consent: Before the procedure and under the influence of no sedative(s), amnesic(s), or anxiolytics, the patient was informed of the treatment options, risks and possible complications. To fulfill our ethical and legal obligations, as recommended by the American Medical Association's Code of Ethics, I have informed the patient of my clinical impression; the nature and purpose of the treatment or procedure; the risks, benefits, and possible complications of the intervention; the alternatives, including doing nothing; the risk(s) and benefit(s) of the alternative treatment(s) or procedure(s); and the risk(s) and benefit(s) of doing nothing. The patient was provided information about the general risks and possible complications associated with the procedure. These may include, but are not limited to: failure to achieve desired goals, infection, bleeding, organ or nerve damage, allergic reactions, paralysis, and death. In addition, the patient was informed of those risks and complications associated to the procedure, such as failure to decrease pain; infection; bleeding; organ or nerve damage with subsequent damage to sensory, motor, and/or autonomic systems, resulting in permanent pain, numbness, and/or weakness of one or several areas of the body; allergic reactions; (i.e.: anaphylactic reaction); and/or  death. Furthermore, the patient was informed of those risks and complications associated with the medications. These include, but are not limited to: allergic reactions (i.e.: anaphylactic or anaphylactoid reaction(s)); adrenal axis suppression; blood sugar  elevation that in diabetics may result in ketoacidosis or comma; water retention that in patients with history of congestive heart failure may result in shortness of breath, pulmonary edema, and decompensation with resultant heart failure; weight gain; swelling or edema; medication-induced neural toxicity; particulate matter embolism and blood vessel occlusion with resultant organ, and/or nervous system infarction; and/or aseptic necrosis of one or more joints. Finally, the patient was informed that Medicine is not an exact science; therefore, there is also the possibility of unforeseen or unpredictable risks and/or possible complications that may result in a catastrophic outcome. The patient indicated having understood very clearly. We have given the patient no guarantees and we have made no promises. Enough time was given to the patient to ask questions, all of which were answered to the patient's satisfaction. Gail Watkins has indicated that she wanted to continue with the procedure. Attestation: I, the ordering provider, attest that I have discussed with the patient the benefits, risks, side-effects, alternatives, likelihood of achieving goals, and potential problems during recovery for the procedure that I have provided informed consent. Date  Time: 03/09/2017 11:42 AM  Pre-Procedure Preparation:  Monitoring: As per clinic protocol. Respiration, ETCO2, SpO2, BP, heart rate and rhythm monitor placed and checked for adequate function Safety Precautions: Patient was assessed for positional comfort and pressure points before starting the procedure. Time-out: I initiated and conducted the "Time-out" before starting the procedure, as per protocol. The  patient was asked to participate by confirming the accuracy of the "Time Out" information. Verification of the correct person, site, and procedure were performed and confirmed by me, the nursing staff, and the patient. "Time-out" conducted as per Joint Commission's Universal Protocol (UP.01.01.01). Time: 1208  Description of Procedure:       Position: Prone Target Area: Inferior, posterior, aspect of the sacroiliac fissure Approach: Posterior, paraspinal, ipsilateral approach. Area Prepped: Entire Lower Lumbosacral Region Prepping solution: ChloraPrep (2% chlorhexidine gluconate and 70% isopropyl alcohol) Safety Precautions: Aspiration looking for blood return was conducted prior to all injections. At no point did we inject any substances, as a needle was being advanced. No attempts were made at seeking any paresthesias. Safe injection practices and needle disposal techniques used. Medications properly checked for expiration dates. SDV (single dose vial) medications used. Description of the Procedure: Protocol guidelines were followed. The patient was placed in position over the procedure table. The target area was identified and the area prepped in the usual manner. Skin & deeper tissues infiltrated with local anesthetic. Appropriate amount of time allowed to pass for local anesthetics to take effect. The procedure needle was advanced under fluoroscopic guidance into the sacroiliac joint until a firm endpoint was obtained. Proper needle placement secured. Negative aspiration confirmed. Solution injected in intermittent fashion, asking for systemic symptoms every 0.5cc of injectate. The needles were then removed and the area cleansed, making sure to leave some of the prepping solution back to take advantage of its long term bactericidal properties. Vitals:   03/09/17 1218 03/09/17 1223 03/09/17 1238 03/09/17 1249  BP: (!) 146/103 (!) 152/97 (!) 147/98 (!) 161/95  Pulse: 99     Resp: 11 13 17 14    Temp:      TempSrc:      SpO2: 99% 98% 100% 100%  Weight:      Height:        Start Time: 1208 hrs. End Time: 1215 hrs. Materials:  Needle(s) Type: Regular needle Gauge: 22G Length: 3.5-in Medication(s): Please see orders for medications and dosing details.  10 cc solution made of 9 cc of 0.2% ropivacaine, 1 cc of Decadron 10 mg/cc.  2.5 cc injected intra-articular, 2.5 cc injected periarticular on each side.   Imaging Guidance (Non-Spinal):  Type of Imaging Technique: Fluoroscopy Guidance (Non-Spinal) Indication(s): Assistance in needle guidance and placement for procedures requiring needle placement in or near specific anatomical locations not easily accessible without such assistance. Exposure Time: Please see nurses notes. Contrast: Before injecting any contrast, we confirmed that the patient did not have an allergy to iodine, shellfish, or radiological contrast. Once satisfactory needle placement was completed at the desired level, radiological contrast was injected. Contrast injected under live fluoroscopy. No contrast complications. See chart for type and volume of contrast used. Fluoroscopic Guidance: I was personally present during the use of fluoroscopy. "Tunnel Vision Technique" used to obtain the best possible view of the target area. Parallax error corrected before commencing the procedure. "Direction-depth-direction" technique used to introduce the needle under continuous pulsed fluoroscopy. Once target was reached, antero-posterior, oblique, and lateral fluoroscopic projection used confirm needle placement in all planes. Images permanently stored in EMR. Interpretation: I personally interpreted the imaging intraoperatively. Adequate needle placement confirmed in multiple planes. Appropriate spread of contrast into desired area was observed. No evidence of afferent or efferent intravascular uptake. Permanent images saved into the patient's record.  Antibiotic Prophylaxis:    Anti-infectives (From admission, onward)   None     Indication(s): None identified  Post-operative Assessment:  Post-procedure Vital Signs:  Pulse Rate: 99 Temp: 98.4 F (36.9 C) Resp: 14 BP: (!) 161/95 SpO2: 100 %  EBL: None  Complications: No immediate post-treatment complications observed by team, or reported by patient.  Note: The patient tolerated the entire procedure well. A repeat set of vitals were taken after the procedure and the patient was kept under observation following institutional policy, for this type of procedure. Post-procedural neurological assessment was performed, showing return to baseline, prior to discharge. The patient was provided with post-procedure discharge instructions, including a section on how to identify potential problems. Should any problems arise concerning this procedure, the patient was given instructions to immediately contact us, at any time, without hesitation. In any case, we plan to contact the patient by telephone for a follow-up status report regarding this interventional procedure.  Comments:  No additional relevant information. 5 out of 5 strength bilateral lower extremity: Plantar flexion, dorsiflexion, knee flexion, knee extension.  Plan of Care   Imaging Orders     DG C-Arm 1-60 Min-No Report Procedure Orders    No procedure(s) ordered today   Patient's last UDS was positive for benzodiazepines.  She was honest with me about this.  She states that she has stopped taking benzodiazepines since her my chart message to me.  Will repeat UDS today.  Have notified patient that she cannot take any benzodiazepines if she is on opioid medications that are prescribed from this clinic.  Patient endorsed understanding.  Patient to follow-up in 1 month.  Orders Placed This Encounter  Procedures  . DG C-Arm 1-60 Min-No Report    Intraoperative interpretation by procedural physician at [facility].    Standing Status:   Standing     Number of Occurrences:   1    Order Specific Question:   Reason for exam:    Answer:   Assistance in needle guidance and placement for procedures requiring needle placement in or near specific anatomical locations not easily accessible without such assistance.  . Compliance Drug Analysis, Ur  Volume: 30 ml(s). Minimum 3 ml of urine is needed. Document temperature of fresh sample. Indications: Long term (current) use of opiate analgesic (Z79.891) Test#: [id number] (Comprehensive Profile)    Medications ordered for procedure: Meds ordered this encounter  Medications  . lactated ringers infusion 1,000 mL  . fentaNYL (SUBLIMAZE) injection 25-100 mcg    Make sure Narcan is available in the pyxis when using this medication. In the event of respiratory depression (RR< 8/min): Titrate NARCAN (naloxone) in increments of 0.1 to 0.2 mg IV at 2-3 minute intervals, until desired degree of reversal.  . lidocaine (PF) (XYLOCAINE) 1 % injection 10 mL  . ropivacaine (PF) 2 mg/mL (0.2%) (NAROPIN) injection 10 mL  . dexamethasone (DECADRON) injection 10 mg   Medications administered: We administered lactated ringers, fentaNYL, lidocaine (PF), ropivacaine (PF) 2 mg/mL (0.2%), and dexamethasone.  See the medical record for exact dosing, route, and time of administration.  New Prescriptions   No medications on file   Disposition: Discharge home  Discharge Date & Time: 03/09/2017; 1250 hrs.   Physician-requested Follow-up: Return in about 4 weeks (around 04/06/2017) for Post Procedure Evaluation.  Future Appointments  Date Time Provider Department Center  03/31/2017  9:35 AM Willodean Rosenthal, MD [facility]  04/07/2017  8:30 AM Edward Jolly, MD [facility] None   Primary Care Physician: Sharlene Dory, DO Location: [facility] Facility Note by: Edward Jolly, MD Date: 03/09/2017; Time: 12:58 PM  Disclaimer:  Medicine is not an exact science. The only guarantee in  medicine is that nothing is guaranteed. It is important to note that the decision to proceed with this intervention was based on the information collected from the patient. The Data and conclusions were drawn from the patient's questionnaire, the interview, and the physical examination. Because the information was provided in large part by the patient, it cannot be guaranteed that it has not been purposely or unconsciously manipulated. Every effort has been made to obtain as much relevant data as possible for this evaluation. It is important to note that the conclusions that lead to this procedure are derived in large part from the available data. Always take into account that the treatment will also be dependent on availability of resources and existing treatment guidelines, considered by other Pain Management Practitioners as being common knowledge and practice, at the time of the intervention. For Medico-Legal purposes, it is also important to point out that variation in procedural techniques and pharmacological choices are the acceptable norm. The indications, contraindications, technique, and results of the above procedure should only be interpreted and judged by a Board-Certified Interventional Pain Specialist with extensive familiarity and expertise in the same exact procedure and technique.

## 2017-03-10 ENCOUNTER — Telehealth: Payer: Self-pay | Admitting: *Deleted

## 2017-03-10 NOTE — Telephone Encounter (Signed)
Denies complications post procedure. 

## 2017-03-14 LAB — COMPLIANCE DRUG ANALYSIS, UR

## 2017-03-19 ENCOUNTER — Encounter: Payer: Self-pay | Admitting: Student in an Organized Health Care Education/Training Program

## 2017-03-20 ENCOUNTER — Other Ambulatory Visit: Payer: Self-pay | Admitting: Family Medicine

## 2017-03-20 ENCOUNTER — Encounter: Payer: Self-pay | Admitting: Family Medicine

## 2017-03-20 ENCOUNTER — Ambulatory Visit: Payer: Self-pay | Admitting: Family Medicine

## 2017-03-20 DIAGNOSIS — M461 Sacroiliitis, not elsewhere classified: Secondary | ICD-10-CM

## 2017-03-20 MED ORDER — TRAMADOL HCL 50 MG PO TABS: 100.0000 mg | ORAL_TABLET | Freq: Two times a day (BID) | ORAL | 2 refills | Status: DC | PRN

## 2017-03-23 ENCOUNTER — Encounter: Payer: Self-pay | Admitting: Family Medicine

## 2017-03-31 ENCOUNTER — Ambulatory Visit: Payer: BLUE CROSS/BLUE SHIELD | Admitting: Family Medicine

## 2017-04-07 ENCOUNTER — Other Ambulatory Visit: Payer: Self-pay

## 2017-04-07 ENCOUNTER — Ambulatory Visit
Payer: BLUE CROSS/BLUE SHIELD | Attending: Student in an Organized Health Care Education/Training Program | Admitting: Student in an Organized Health Care Education/Training Program

## 2017-04-07 ENCOUNTER — Encounter: Payer: Self-pay | Admitting: Student in an Organized Health Care Education/Training Program

## 2017-04-07 VITALS — BP 144/99 | HR 95 | Temp 98.2°F | Resp 18 | Ht 72.0 in | Wt 195.0 lb

## 2017-04-07 DIAGNOSIS — Z881 Allergy status to other antibiotic agents status: Secondary | ICD-10-CM | POA: Insufficient documentation

## 2017-04-07 DIAGNOSIS — F339 Major depressive disorder, recurrent, unspecified: Secondary | ICD-10-CM | POA: Insufficient documentation

## 2017-04-07 DIAGNOSIS — M7918 Myalgia, other site: Secondary | ICD-10-CM

## 2017-04-07 DIAGNOSIS — F17210 Nicotine dependence, cigarettes, uncomplicated: Secondary | ICD-10-CM | POA: Insufficient documentation

## 2017-04-07 DIAGNOSIS — Z882 Allergy status to sulfonamides status: Secondary | ICD-10-CM | POA: Insufficient documentation

## 2017-04-07 DIAGNOSIS — Z5181 Encounter for therapeutic drug level monitoring: Secondary | ICD-10-CM | POA: Insufficient documentation

## 2017-04-07 DIAGNOSIS — Z79899 Other long term (current) drug therapy: Secondary | ICD-10-CM | POA: Diagnosis not present

## 2017-04-07 DIAGNOSIS — M549 Dorsalgia, unspecified: Secondary | ICD-10-CM | POA: Diagnosis not present

## 2017-04-07 DIAGNOSIS — M2550 Pain in unspecified joint: Secondary | ICD-10-CM

## 2017-04-07 DIAGNOSIS — M533 Sacrococcygeal disorders, not elsewhere classified: Secondary | ICD-10-CM | POA: Insufficient documentation

## 2017-04-07 DIAGNOSIS — G894 Chronic pain syndrome: Secondary | ICD-10-CM | POA: Diagnosis not present

## 2017-04-07 DIAGNOSIS — F4310 Post-traumatic stress disorder, unspecified: Secondary | ICD-10-CM | POA: Insufficient documentation

## 2017-04-07 DIAGNOSIS — Z888 Allergy status to other drugs, medicaments and biological substances status: Secondary | ICD-10-CM | POA: Diagnosis not present

## 2017-04-07 DIAGNOSIS — Z79891 Long term (current) use of opiate analgesic: Secondary | ICD-10-CM | POA: Diagnosis not present

## 2017-04-07 DIAGNOSIS — M47818 Spondylosis without myelopathy or radiculopathy, sacral and sacrococcygeal region: Secondary | ICD-10-CM

## 2017-04-07 MED ORDER — TIZANIDINE HCL 4 MG PO TABS: 4.0000 mg | ORAL_TABLET | Freq: Two times a day (BID) | ORAL | 1 refills | Status: DC | PRN

## 2017-04-07 MED ORDER — OXYCODONE HCL 5 MG PO TABS: 5.0000 mg | ORAL_TABLET | Freq: Every day | ORAL | 0 refills | Status: DC | PRN

## 2017-04-07 NOTE — Progress Notes (Signed)
Patient's Name: Gail Watkins  MRN: [id number]  Referring Provider: Shelda Pal*  DOB: [birth year]  PCP: Shelda Pal, DO  DOS: 04/07/2017  Note by: Gillis Santa, MD  Service setting: Ambulatory outpatient  Specialty: Interventional Pain Management  Location: [facility] (AMB) Pain Management Facility    Patient type: Established   Primary Reason(s) for Visit: Encounter for prescription drug management. (Level of risk: moderate)  CC: Back Pain  HPI  [name] is a 42 y.o. year old, female patient, who comes today for a medication management evaluation. She has Sacroiliitis ([place]); Panic attack; SI joint arthritis; Chronic pain syndrome; PTSD (post-traumatic stress disorder); and Deviated septum on their problem list. Her primarily concern today is the Back Pain  Pain Assessment: Location: Lower Back Radiating: radiates up left side and down left leg to foot to top of foot Onset: More than a month ago Duration:   Quality: Shooting, Numbness Severity: 8 /10 (self-reported pain score)  Note: Reported level is compatible with observation.                         When using our objective Pain Scale, levels between 6 and 10/10 are said to belong in an emergency room, as it progressively worsens from a 6/10, described as severely limiting, requiring emergency care not usually available at an outpatient pain management facility. At a 6/10 level, communication becomes difficult and requires great effort. Assistance to reach the emergency department may be required. Facial flushing and profuse sweating along with potentially dangerous increases in heart rate and blood pressure will be evident. Effect on ADL:   Timing: Constant Modifying factors: lying on back and lifting leg, fetal position  [name] was last scheduled for an appointment on 03/09/2017 for medication management. During today's appointment we reviewed [name]'s chronic pain status, as well as her outpatient medication  regimen.  The patient  reports that she does not use drugs. Her body mass index is 26.45 kg/m.  Further details on both, my assessment(s), as well as the proposed treatment plan, please see below.  Controlled Substance Pharmacotherapy Assessment REMS (Risk Evaluation and Mitigation Strategy)  Analgesic: Oxycodone 5 mg daily as needed, quantity 30 a month; tramadol 50 mg, 66-monthMME/day: Approximately 25-30 mg/day.  TDewayne Shorter RN  04/07/2017  8:35 AM  Signed Nursing Pain Medication Assessment:  Safety precautions to be maintained throughout the outpatient stay will include: orient to surroundings, keep bed in low position, maintain call bell within reach at all times, provide assistance with transfer out of bed and ambulation.  Medication Inspection Compliance: Pill count conducted under aseptic conditions, in front of the patient. Neither the pills nor the bottle was removed from the patient's sight at any time. Once count was completed pills were immediately returned to the patient in their original bottle.  Medication: Oxycodone IR Pill/Patch Count: 0 of 30 pills remain Pill/Patch Appearance: Markings consistent with prescribed medication Bottle Appearance: Standard pharmacy container. Clearly labeled. Filled Date: 03/ 09 / 2019 Last Medication intake:  Yesterday   Pharmacokinetics: Liberation and absorption (onset of action): WNL Distribution (time to peak effect): WNL Metabolism and excretion (duration of action): WNL         Pharmacodynamics: Desired effects: Analgesia: Ms. CKleyreports >50% benefit. Functional ability: Patient reports that medication allows her to accomplish basic ADLs Clinically meaningful improvement in function (CMIF): Sustained CMIF goals met Perceived effectiveness: Described as relatively effective, allowing for increase in activities  of daily living (ADL) Undesirable effects: Side-effects or Adverse reactions: None reported Monitoring: [place] PMP:  Online review of the past 63-monthperiod conducted. Compliant with practice rules and regulations Last UDS on record: Summary  Date Value Ref Range Status  03/09/2017 FINAL  Final    Comment:    ==================================================================== [facility] COMP DRUG ANALYSIS,UR ==================================================================== Test                             Result       Flag       Units Drug Present and Declared for Prescription Verification   Oxycodone                      1741         EXPECTED   ng/mg creat   Oxymorphone                    792          EXPECTED   ng/mg creat   Noroxycodone                   3268         EXPECTED   ng/mg creat   Noroxymorphone                 395          EXPECTED   ng/mg creat    Sources of oxycodone are scheduled prescription medications.    Oxymorphone, noroxycodone, and noroxymorphone are expected    metabolites of oxycodone. Oxymorphone is also available as a    scheduled prescription medication.   Tramadol                       1900         EXPECTED   ng/mg creat   O-Desmethyltramadol            1846         EXPECTED   ng/mg creat   N-Desmethyltramadol            611          EXPECTED   ng/mg creat    Source of tramadol is a prescription medication.    O-desmethyltramadol and N-desmethyltramadol are expected    metabolites of tramadol.   Nortriptyline                  PRESENT      EXPECTED    Nortriptyline may be administered as a prescription drug; it is    also an expected metabolite of amitriptyline. Drug Present not Declared for Prescription Verification   Desmethyldiazepam              59           UNEXPECTED ng/mg creat   Oxazepam                       430          UNEXPECTED ng/mg creat   Temazepam                      95           UNEXPECTED ng/mg creat    Desmethyldiazepam, oxazepam, and temazepam are benzodiazepine    drugs, but may also be present as common metabolites of other  benzodiazepine drugs, including diazepam.   7-aminoclonazepam              176          UNEXPECTED ng/mg creat    7-aminoclonazepam is an expected metabolite of clonazepam. Source    of clonazepam is a scheduled prescription medication.   Ephedrine/Pseudoephedrine      PRESENT      UNEXPECTED   Phenylpropanolamine            PRESENT      UNEXPECTED    Source of ephedrine/pseudoephedrine is most commonly    pseudoephedrine in over-the-counter or prescription cold and    allergy medications. Phenylpropanolamine is an expected    metabolite of ephedrine/pseudoephedrine.   Acetaminophen                  PRESENT      UNEXPECTED   Ibuprofen                      PRESENT      UNEXPECTED   Naproxen                       PRESENT      UNEXPECTED   Guaifenesin                    PRESENT      UNEXPECTED    Guaifenesin may be administered as an over-the-counter or    prescription drug; it may also be present as a breakdown product    of methocarbamol. Drug Absent but Declared for Prescription Verification   Gabapentin                     Not Detected UNEXPECTED   Diclofenac                     Not Detected UNEXPECTED    Diclofenac, as indicated in the declared medication list, is not    always detected even when used as directed.   Hydroxyzine                    Not Detected UNEXPECTED ==================================================================== Test                      Result    Flag   Units      Ref Range   Creatinine              37               mg/dL      >=20 ==================================================================== Declared Medications:  The flagging and interpretation on this report are based on the  following declared medications.  Unexpected results may arise from  inaccuracies in the declared medications.  **Note: The testing scope of this panel includes these medications:  Gabapentin  Hydroxyzine  Nortriptyline  Oxycodone  Tramadol  **Note: The testing scope of  this panel does not include small to  moderate amounts of these reported medications:  Diclofenac  **Note: The testing scope of this panel does not include following  reported medications:  Amoxicillin  Clavulinate  Dicyclomine  Ethinyl estradiol  Melatonin  Meloxicam  Norethindrone  Omeprazole  Potassium  Vitamin D2 (Ergocalciferol) ==================================================================== For clinical consultation, please call [phone]. ====================================================================    UDS interpretation: Compliant          Medication Assessment Form: Reviewed. Patient indicates being  compliant with therapy Treatment compliance: Compliant Risk Assessment Profile: Aberrant behavior: See prior evaluations. None observed or detected today Comorbid factors increasing risk of overdose: See prior notes. No additional risks detected today Risk of substance use disorder (SUD): Low Opioid Risk Tool - 04/07/17 0834      Family History of Substance Abuse   Alcohol  Negative    Illegal Drugs  Negative    Rx Drugs  Negative      Personal History of Substance Abuse   Alcohol  Negative    Illegal Drugs  Negative    Rx Drugs  Negative      Age   Age between 60-45 years   No      History of Preadolescent Sexual Abuse   History of Preadolescent Sexual Abuse  Negative or Female      Psychological Disease   Psychological Disease  Positive ptsd   ptsd   Depression  Positive      Total Score   Opioid Risk Tool Scoring  3    Opioid Risk Interpretation  Low Risk      ORT Scoring interpretation table:  Score <3 = Low Risk for SUD  Score between 4-7 = Moderate Risk for SUD  Score >8 = High Risk for Opioid Abuse   Risk Mitigation Strategies:  Patient Counseling: Covered Patient-Prescriber Agreement (PPA): Present and active  Notification to other healthcare providers: Done  Pharmacologic Plan: Patient states that she has difficulty  affording her visits given that she recently lost Medicare.  Patient's primary care physician is prescribing her tramadol and I recommended that the patient have one prescriber for her opioid medications.  Patient can either have her PCP take over the oxycodone which is a low dose 5 mg daily for breakthrough pain or if the PCP does not prefer that, I can take over the patient's tramadol and oxycodone.  Patient prefers to have her primary care physician manage her opioid medications and that way she can see me as needed for SI joint injections.  Laboratory Chemistry  Inflammation Markers (CRP: Acute Phase) (ESR: Chronic Phase) No results found for: CRP, ESRSEDRATE, LATICACIDVEN                       Rheumatology Markers No results found for: RF, ANA, LABURIC, URICUR, LYMEIGGIGMAB, LYMEABIGMQN                      Renal Function Markers No results found for: BUN, CREATININE, GFRAA, GFRNONAA                            Hepatic Function Markers No results found for: AST, ALT, ALBUMIN, ALKPHOS, HCVAB, AMYLASE, LIPASE, AMMONIA                      Electrolytes No results found for: NA, K, CL, CALCIUM, MG, PHOS                      Neuropathy Markers No results found for: VITAMINB12, FOLATE, HGBA1C, HIV                      Bone Pathology Markers No results found for: [place], [id number], [id number], [id number], 25OHVITD1, 25OHVITD2, 25OHVITD3, TESTOFREE, TESTOSTERONE                       Coagulation Parameters  Lab Results  Component Value Date   INR 1.0 12/29/2006   LABPROT 13.1 12/29/2006   APTT 30 12/29/2006   PLT 152 12/31/2006                        Cardiovascular Markers Lab Results  Component Value Date   HGB 8.9 DELTA CHECK NOTED (L) 12/31/2006   HCT 26.5 (L) 12/31/2006                         CA Markers No results found for: CEA, CA125, LABCA2                      Note: Lab results reviewed.  Recent Diagnostic Imaging Results  DG C-Arm 1-60 Min-No Report Fluoroscopy  was utilized by the requesting physician.  No radiographic  interpretation.   Complexity Note: Imaging results reviewed. Results shared with Ms. Freddrick March, using Layman's terms.                         Meds   Current Outpatient Medications:  .  dicyclomine (BENTYL) 10 MG capsule, Take 1 capsule (10 mg total) by mouth 4 (four) times daily -  before meals and at bedtime., Disp: 120 capsule, Rfl: 2 .  hydrOXYzine (ATARAX/VISTARIL) 50 MG tablet, TAKE 1 TABLET(50 MG) BY MOUTH THREE TIMES DAILY AS NEEDED FOR ANXIETY, Disp: 90 tablet, Rfl: 1 .  Melatonin 5 MG TABS, Take 10 tablets by mouth at bedtime., Disp: , Rfl:  .  meloxicam (MOBIC) 15 MG tablet, Take 1 tablet (15 mg total) by mouth daily., Disp: 30 tablet, Rfl: 2 .  NON FORMULARY, 1 tablet 3 (three) times daily. CBD pills, Disp: , Rfl:  .  norethindrone-ethinyl estradiol (JUNEL FE 1/20) 1-20 MG-MCG tablet, Take 1 tablet by mouth daily., Disp: 1 Package, Rfl: 11 .  nortriptyline (PAMELOR) 25 MG capsule, Take 2 capsules (50 mg total) by mouth at bedtime., Disp: 30 capsule, Rfl: 2 .  omeprazole (PRILOSEC) 40 MG capsule, Take 1 capsule (40 mg total) by mouth daily., Disp: 30 capsule, Rfl: 5 .  oxyCODONE (OXY IR/ROXICODONE) 5 MG immediate release tablet, Take 1 tablet (5 mg total) by mouth daily as needed for severe pain. For chronic pain, Disp: 30 tablet, Rfl: 0 .  traMADol (ULTRAM) 50 MG tablet, Take 2 tablets (100 mg total) by mouth every 12 (twelve) hours as needed for moderate pain., Disp: 120 tablet, Rfl: 2 .  Diclofenac Sodium (VOLTAREN PO), Take by mouth., Disp: , Rfl:  .  ergocalciferol (VITAMIN D2) 50000 units capsule, Take 50,000 Units by mouth once a week., Disp: , Rfl:  .  gabapentin (NEURONTIN) 300 MG capsule, Take 1 capsule (300 mg total) by mouth 3 (three) times daily. (Patient not taking: Reported on 03/09/2017), Disp: 90 capsule, Rfl: 2 .  tiZANidine (ZANAFLEX) 4 MG tablet, Take 1 tablet (4 mg total) by mouth 2 (two) times daily as needed  for muscle spasms., Disp: 60 tablet, Rfl: 1  ROS  Constitutional: Denies any fever or chills Gastrointestinal: No reported hemesis, hematochezia, vomiting, or acute GI distress Musculoskeletal: Denies any acute onset joint swelling, redness, loss of ROM, or weakness Neurological: No reported episodes of acute onset apraxia, aphasia, dysarthria, agnosia, amnesia, paralysis, loss of coordination, or loss of consciousness  Allergies  Ms. Laflam is allergic to sulfa antibiotics; vancomycin; reglan [metoclopramide]; and terbutaline.  PFSH  Drug: Ms.  Oswald  reports that she does not use drugs. Alcohol:  reports that she does not drink alcohol. Tobacco:  reports that she has been smoking cigarettes.  She started smoking about 25 years ago. She has never used smokeless tobacco. Medical:  has a past medical history of Allergy, Depression, Eating disorder, Fibromyalgia, Hay fever, History of chicken pox, History of fainting spells of unknown cause, Migraine, Osteoarthritis, Osteopenia, Sacroiliitis ([place]), and Scheurmann's disease. Surgical: Ms. Cristiano  has a past surgical history that includes Cesarean section; cystectomy; Dilation and curettage of uterus; and Ovarian cyst removal. Family: family history includes Alzheimer's disease in her mother; Cancer in her father; Diabetes in her father; Heart attack in her father; Mental illness in her father; Microcephaly in her father; Stroke in her father.  Constitutional Exam  General appearance: Well nourished, well developed, and well hydrated. In no apparent acute distress Vitals:   04/07/17 0826  BP: (!) 144/99  Pulse: 95  Resp: 18  Temp: 98.2 F (36.8 C)  SpO2: 100%  Weight: 195 lb (88.5 kg)  Height: 6' (1.829 m)   BMI Assessment: Estimated body mass index is 26.45 kg/m as calculated from the following:   Height as of this encounter: 6' (1.829 m).   Weight as of this encounter: 195 lb (88.5 kg).  BMI interpretation table: BMI level  Category Range association with higher incidence of chronic pain  <18 kg/m2 Underweight   18.5-24.9 kg/m2 Ideal body weight   25-29.9 kg/m2 Overweight Increased incidence by 20%  30-34.9 kg/m2 Obese (Class I) Increased incidence by 68%  35-39.9 kg/m2 Severe obesity (Class II) Increased incidence by 136%  >40 kg/m2 Extreme obesity (Class III) Increased incidence by 254%   BMI Readings from Last 4 Encounters:  04/07/17 26.45 kg/m  03/09/17 26.45 kg/m  03/06/17 26.36 kg/m  02/12/17 25.09 kg/m   Wt Readings from Last 4 Encounters:  04/07/17 195 lb (88.5 kg)  03/09/17 195 lb (88.5 kg)  03/06/17 194 lb 6 oz (88.2 kg)  02/12/17 185 lb (83.9 kg)  Psych/Mental status: Alert, oriented x 3 (person, place, & time)       Eyes: PERLA Respiratory: No evidence of acute respiratory distress  Cervical Spine Area Exam  Skin & Axial Inspection: No masses, redness, edema, swelling, or associated skin lesions Alignment: Symmetrical Functional ROM: Unrestricted ROM      Stability: No instability detected Muscle Tone/Strength: Functionally intact. No obvious neuro-muscular anomalies detected. Sensory (Neurological): Unimpaired Palpation: No palpable anomalies              Upper Extremity (UE) Exam    Side: Right upper extremity  Side: Left upper extremity  Skin & Extremity Inspection: Skin color, temperature, and hair growth are WNL. No peripheral edema or cyanosis. No masses, redness, swelling, asymmetry, or associated skin lesions. No contractures.  Skin & Extremity Inspection: Skin color, temperature, and hair growth are WNL. No peripheral edema or cyanosis. No masses, redness, swelling, asymmetry, or associated skin lesions. No contractures.  Functional ROM: Unrestricted ROM          Functional ROM: Unrestricted ROM          Muscle Tone/Strength: Functionally intact. No obvious neuro-muscular anomalies detected.  Muscle Tone/Strength: Functionally intact. No obvious neuro-muscular anomalies  detected.  Sensory (Neurological): Unimpaired          Sensory (Neurological): Unimpaired          Palpation: No palpable anomalies  Palpation: No palpable anomalies              Specialized Test(s): Deferred         Specialized Test(s): Deferred          Thoracic Spine Area Exam  Skin & Axial Inspection: No masses, redness, or swelling Alignment: Symmetrical Functional ROM: Unrestricted ROM Stability: No instability detected Muscle Tone/Strength: Functionally intact. No obvious neuro-muscular anomalies detected. Sensory (Neurological): Unimpaired Muscle strength & Tone: No palpable anomalies  Lumbar Spine Area Exam  Skin & Axial Inspection: No masses, redness, or swelling Alignment: Symmetrical Functional ROM: Decreased ROM      Stability: No instability detected Muscle Tone/Strength: Functionally intact. No obvious neuro-muscular anomalies detected. Sensory (Neurological): Unimpaired Palpation: No palpable anomalies       Provocative Tests: Lumbar Hyperextension and rotation test: Positive bilaterally for facet joint pain. Lumbar Lateral bending test: Positive       Patrick's Maneuver: Positive for bilateral S-I arthralgia improved after treatment              Gait & Posture Assessment  Ambulation: Limited Gait: Antalgic Posture: WNL   Lower Extremity Exam    Side: Right lower extremity  Side: Left lower extremity  Skin & Extremity Inspection: Skin color, temperature, and hair growth are WNL. No peripheral edema or cyanosis. No masses, redness, swelling, asymmetry, or associated skin lesions. No contractures.  Skin & Extremity Inspection: Skin color, temperature, and hair growth are WNL. No peripheral edema or cyanosis. No masses, redness, swelling, asymmetry, or associated skin lesions. No contractures.  Functional ROM: Unrestricted ROM          Functional ROM: Unrestricted ROM          Muscle Tone/Strength: Functionally intact. No obvious neuro-muscular anomalies  detected.  Muscle Tone/Strength: Functionally intact. No obvious neuro-muscular anomalies detected.  Sensory (Neurological): Unimpaired  Sensory (Neurological): Unimpaired  Palpation: No palpable anomalies  Palpation: No palpable anomalies   Assessment  Primary Diagnosis & Pertinent Problem List: The primary encounter diagnosis was SI joint arthritis. Diagnoses of Chronic pain syndrome, Recurrent major depressive disorder, remission status unspecified ([place]), PTSD (post-traumatic stress disorder), Myofascial pain, and Arthralgia, unspecified joint were also pertinent to this visit.  Status Diagnosis  Persistent Persistent Persistent 1. SI joint arthritis   2. Chronic pain syndrome   3. Recurrent major depressive disorder, remission status unspecified ([place])   4. PTSD (post-traumatic stress disorder)   5. Myofascial pain   6. Arthralgia, unspecified joint     General Recommendations: The pain condition that the patient suffers from is best treated with a multidisciplinary approach that involves an increase in physical activity to prevent de-conditioning and worsening of the pain cycle, as well as psychological counseling (formal and/or informal) to address the co-morbid psychological affects of pain. Treatment will often involve judicious use of pain medications and interventional procedures to decrease the pain, allowing the patient to participate in the physical activity that will ultimately produce long-lasting pain reductions. The goal of the multidisciplinary approach is to return the patient to a higher level of overall function and to restore their ability to perform activities of daily living.  42 year old female with a history of axial low back pain left greater than right, left hip and SI joint pain that radiates into her left leg. 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 Patient  does have a positive Patrick's on the left suggesting SI joint dysfunction. Patient's  lumbar spine MRI is unremarkable. Patient's thoracic MRI from 2005 shows very minor right posterior lateral disc protrusion at T7-T8 level at the T10-T11 level without nerve root compression. I do not believe this to be causing the majority of her back pain. Furthermore the patient's x-rays of her sacrum and coccyx were largely unremarkable although the patient does have pain with palpation overlying her inferior portion of PSIS along with positive Patrick's on left greater than right.  Patient returns for follow-up status post bilateral SI joint injections on 03/09/2017 which provided her with benefit that is ongoing.  Patient states that this last weekend she strained her left lumbar paraspinal muscle and has been having more difficulty with pain.  She states that this acute lumbar sprain led her to take more oxycodone's which I counseled her on.  I also had an extensive discussion with the patient about therapeutic plan.  Patient is receiving tramadol from her primary care physician and oxycodone prescribed by me.  When patient sign opioid contract, she agreed to having opioids come from one prescriber and I told the patient that either her primary care physician can manage her tramadol and oxycodone 5 mg daily as needed quantity 30 a month or I can manage both of those medications.  Patient prefers to have her PCP manage her meds given her financial situation at the time.  She is under a lot of stress since she has lost Medicare and is having family stressors.  I will also repeat a benzodiazepine screen as the patient has been notified in the past that she must refrain from any benzodiazepines and her last UDS was positive for benzodiazepine.  Patient states that this UDS will be negative.  I will also place as needed order for bilateral SI joint injection when patient has return of SI joint pain  symptoms.  Plan: -Refill tizanidine as below.  Prescription for oxycodone for 1 month. -Notified patient that all opioid prescriptions must come from one prescriber.  Patient prefers that her PCP take over given her financial situation at this time and the cost for specialist visits which is reasonable.  Either way at the next visit, patient's PCP or myself will be the sole prescriber of all of her opioid medications. -PRN SI joint injection bilaterally   Plan of Care  Pharmacotherapy (Medications Ordered): Meds ordered this encounter  Medications  . tiZANidine (ZANAFLEX) 4 MG tablet    Sig: Take 1 tablet (4 mg total) by mouth 2 (two) times daily as needed for muscle spasms.    Dispense:  60 tablet    Refill:  1  . oxyCODONE (OXY IR/ROXICODONE) 5 MG immediate release tablet    Sig: Take 1 tablet (5 mg total) by mouth daily as needed for severe pain. For chronic pain    Dispense:  30 tablet    Refill:  0   Lab-work, procedure(s), and/or referral(s): Orders Placed This Encounter  Procedures  . SACROILIAC JOINT INJECTION  . Benzodiazepine Screen, Urine    Provider-requested follow-up: Return in about 1 month (around 05/05/2017) for Medication Management. Time Note: Greater than 50% of the 25 minute(s) of face-to-face time spent with Ms. Gianfrancesco, was spent in counseling/coordination of care regarding: the appropriate use of the pain scale, Ms. Decamp's primary cause of pain, the treatment plan, treatment alternatives, going over the informed consent, the opioid analgesic risks and possible complications, the results, interpretation and significance of  her recent diagnostic interventional treatment(s), the appropriate use of her medications, realistic  expectations, the medication agreement and the patient's responsibilities when it comes to controlled substances. Future Appointments  Date Time Provider [place]  04/20/2017  9:00 AM Cottle, Lucious Groves, LCSW [facility] None  05/05/2017   9:30 AM Gillis Santa, MD [facility] None  05/11/2017  3:35 PM Donnamae Jude, [facility]    Primary Care Physician: Shelda Pal, DO Location: [facility] Note by: Gillis Santa, M.D Date: 04/07/2017; Time: 9:50 AM  Patient Instructions  Sacroiliac (SI) Joint Injection Patient Information  Description: The sacroiliac joint connects the scrum (very low back and tailbone) to the ilium (a pelvic bone which also forms half of the hip joint).  Normally this joint experiences very little motion.  When this joint becomes inflamed or unstable low back and or hip and pelvis pain may result.  Injection of this joint with local anesthetics (numbing medicines) and steroids can provide diagnostic information and reduce pain.  This injection is performed with the aid of x-ray guidance into the tailbone area while you are lying on your stomach.   You may experience an electrical sensation down the leg while this is being done.  You may also experience numbness.  We also may ask if we are reproducing your normal pain during the injection.  Conditions which may be treated SI injection:   Low back, buttock, hip or leg pain  Preparation for the Injection:  1. Do not eat any solid food or dairy products within 8 hours of your appointment.  2. You may drink clear liquids up to 3 hours before appointment.  Clear liquids include water, black coffee, juice or soda.  No milk or cream please. 3. You may take your regular medications, including pain medications with a sip of water before your appointment.  Diabetics should hold regular insulin (if take separately) and take 1/2 normal NPH dose the morning of the procedure.  Carry some sugar containing items with you to your appointment. 4. A driver must accompany you and be prepared to drive you home after your procedure. 5. Bring all of your current medications with you. 6. An IV may be inserted and sedation may be given at  the discretion of the physician. 7. A blood pressure cuff, EKG and other monitors will often be applied during the procedure.  Some patients may need to have extra oxygen administered for a short period.  8. You will be asked to provide medical information, including your allergies, prior to the procedure.  We must know immediately if you are taking blood thinners (like Coumadin/Warfarin) or if you are allergic to IV iodine contrast (dye).  We must know if you could possible be pregnant.  Possible side effects:   Bleeding from needle site  Infection (rare, may require surgery)  Nerve injury (rare)  Numbness & tingling (temporary)  A brief convulsion or seizure  Light-headedness (temporary)  Pain at injection site (several days)  Decreased blood pressure (temporary)  Weakness in the leg (temporary)   Call if you experience:   New onset weakness or numbness of an extremity below the injection site that last more than 8 hours.  Hives or difficulty breathing ( go to the emergency room)  Inflammation or drainage at the injection site  Any new symptoms which are concerning to you  Please note:  Although the local anesthetic injected can often make your back/ hip/ buttock/ leg feel good for several hours after the injections, the pain will likely return.  It  takes 3-7 days for steroids to work in the sacroiliac area.  You may not notice any pain relief for at least that one week.  If effective, we will often do a series of three injections spaced 3-6 weeks apart to maximally decrease your pain.  After the initial series, we generally will wait some months before a repeat injection of the same type.  If you have any questions, please call [phone] [facility]

## 2017-04-07 NOTE — Progress Notes (Signed)
Nursing Pain Medication Assessment:  Safety precautions to be maintained throughout the outpatient stay will include: orient to surroundings, keep bed in low position, maintain call bell within reach at all times, provide assistance with transfer out of bed and ambulation.  Medication Inspection Compliance: Pill count conducted under aseptic conditions, in front of the patient. Neither the pills nor the bottle was removed from the patient's sight at any time. Once count was completed pills were immediately returned to the patient in their original bottle.  Medication: Oxycodone IR Pill/Patch Count: 0 of 30 pills remain Pill/Patch Appearance: Markings consistent with prescribed medication Bottle Appearance: Standard pharmacy container. Clearly labeled. Filled Date: 03/ 09 / 2019 Last Medication intake:  Yesterday

## 2017-04-07 NOTE — Patient Instructions (Signed)
Sacroiliac (SI) Joint Injection Patient Information  Description: The sacroiliac joint connects the scrum (very low back and tailbone) to the ilium (a pelvic bone which also forms half of the hip joint).  Normally this joint experiences very little motion.  When this joint becomes inflamed or unstable low back and or hip and pelvis pain may result.  Injection of this joint with local anesthetics (numbing medicines) and steroids can provide diagnostic information and reduce pain.  This injection is performed with the aid of x-ray guidance into the tailbone area while you are lying on your stomach.   You may experience an electrical sensation down the leg while this is being done.  You may also experience numbness.  We also may ask if we are reproducing your normal pain during the injection.  Conditions which may be treated SI injection:   Low back, buttock, hip or leg pain  Preparation for the Injection:  1. Do not eat any solid food or dairy products within 8 hours of your appointment.  2. You may drink clear liquids up to 3 hours before appointment.  Clear liquids include water, black coffee, juice or soda.  No milk or cream please. 3. You may take your regular medications, including pain medications with a sip of water before your appointment.  Diabetics should hold regular insulin (if take separately) and take 1/2 normal NPH dose the morning of the procedure.  Carry some sugar containing items with you to your appointment. 4. A driver must accompany you and be prepared to drive you home after your procedure. 5. Bring all of your current medications with you. 6. An IV may be inserted and sedation may be given at the discretion of the physician. 7. A blood pressure cuff, EKG and other monitors will often be applied during the procedure.  Some patients may need to have extra oxygen administered for a short period.  8. You will be asked to provide medical information, including your allergies,  prior to the procedure.  We must know immediately if you are taking blood thinners (like Coumadin/Warfarin) or if you are allergic to IV iodine contrast (dye).  We must know if you could possible be pregnant.  Possible side effects:   Bleeding from needle site  Infection (rare, may require surgery)  Nerve injury (rare)  Numbness & tingling (temporary)  A brief convulsion or seizure  Light-headedness (temporary)  Pain at injection site (several days)  Decreased blood pressure (temporary)  Weakness in the leg (temporary)   Call if you experience:   New onset weakness or numbness of an extremity below the injection site that last more than 8 hours.  Hives or difficulty breathing ( go to the emergency room)  Inflammation or drainage at the injection site  Any new symptoms which are concerning to you  Please note:  Although the local anesthetic injected can often make your back/ hip/ buttock/ leg feel good for several hours after the injections, the pain will likely return.  It takes 3-7 days for steroids to work in the sacroiliac area.  You may not notice any pain relief for at least that one week.  If effective, we will often do a series of three injections spaced 3-6 weeks apart to maximally decrease your pain.  After the initial series, we generally will wait some months before a repeat injection of the same type.  If you have any questions, please call [phone] [facility]   

## 2017-04-09 LAB — BENZODIAZEPINE SCREEN, URINE: Benzodiazepine Quant, Ur: NEGATIVE ng/mL

## 2017-04-12 ENCOUNTER — Other Ambulatory Visit: Payer: Self-pay | Admitting: Family Medicine

## 2017-04-12 DIAGNOSIS — M2550 Pain in unspecified joint: Secondary | ICD-10-CM

## 2017-04-18 DIAGNOSIS — F1910 Other psychoactive substance abuse, uncomplicated: Secondary | ICD-10-CM | POA: Diagnosis not present

## 2017-04-18 DIAGNOSIS — F10920 Alcohol use, unspecified with intoxication, uncomplicated: Secondary | ICD-10-CM | POA: Diagnosis not present

## 2017-04-18 DIAGNOSIS — S199XXA Unspecified injury of neck, initial encounter: Secondary | ICD-10-CM | POA: Diagnosis not present

## 2017-04-18 DIAGNOSIS — R51 Headache: Secondary | ICD-10-CM | POA: Diagnosis not present

## 2017-04-18 DIAGNOSIS — S022XXA Fracture of nasal bones, initial encounter for closed fracture: Secondary | ICD-10-CM | POA: Diagnosis not present

## 2017-04-18 DIAGNOSIS — F10129 Alcohol abuse with intoxication, unspecified: Secondary | ICD-10-CM | POA: Diagnosis not present

## 2017-04-19 DIAGNOSIS — S022XXA Fracture of nasal bones, initial encounter for closed fracture: Secondary | ICD-10-CM | POA: Diagnosis not present

## 2017-04-19 DIAGNOSIS — F10129 Alcohol abuse with intoxication, unspecified: Secondary | ICD-10-CM | POA: Diagnosis not present

## 2017-04-20 ENCOUNTER — Ambulatory Visit: Payer: Medicaid Other | Admitting: Psychology

## 2017-04-20 ENCOUNTER — Encounter: Payer: Self-pay | Admitting: Family Medicine

## 2017-04-20 MED ORDER — PROMETHAZINE HCL 6.25 MG/5ML PO SYRP: 6.2500 mg | ORAL_SOLUTION | Freq: Three times a day (TID) | ORAL | 0 refills | Status: DC | PRN

## 2017-04-22 ENCOUNTER — Ambulatory Visit (INDEPENDENT_AMBULATORY_CARE_PROVIDER_SITE_OTHER): Payer: BLUE CROSS/BLUE SHIELD | Admitting: Family Medicine

## 2017-04-22 ENCOUNTER — Encounter: Payer: Self-pay | Admitting: Family Medicine

## 2017-04-22 VITALS — BP 132/90 | HR 93 | Temp 98.5°F | Ht 72.0 in | Wt 194.4 lb

## 2017-04-22 DIAGNOSIS — H9192 Unspecified hearing loss, left ear: Secondary | ICD-10-CM | POA: Diagnosis not present

## 2017-04-22 DIAGNOSIS — M2550 Pain in unspecified joint: Secondary | ICD-10-CM

## 2017-04-22 MED ORDER — PREDNISONE 20 MG PO TABS: 40.0000 mg | ORAL_TABLET | Freq: Every day | ORAL | 0 refills | Status: AC

## 2017-04-22 MED ORDER — OXYCODONE HCL 5 MG PO TABS: 5.0000 mg | ORAL_TABLET | Freq: Four times a day (QID) | ORAL | 0 refills | Status: DC | PRN

## 2017-04-22 NOTE — Progress Notes (Signed)
Pre visit review using our clinic review tool, if applicable. No additional management support is needed unless otherwise documented below in the visit note. 

## 2017-04-22 NOTE — Patient Instructions (Signed)
If you do not hear anything about your referral in the next 1-2 weeks, call our office and ask for an update.  
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
## 2017-04-27 DIAGNOSIS — S022XXA Fracture of nasal bones, initial encounter for closed fracture: Secondary | ICD-10-CM | POA: Diagnosis not present

## 2017-04-27 DIAGNOSIS — F17200 Nicotine dependence, unspecified, uncomplicated: Secondary | ICD-10-CM | POA: Diagnosis not present

## 2017-04-27 DIAGNOSIS — R42 Dizziness and giddiness: Secondary | ICD-10-CM | POA: Diagnosis not present

## 2017-04-27 DIAGNOSIS — F4310 Post-traumatic stress disorder, unspecified: Secondary | ICD-10-CM | POA: Diagnosis not present

## 2017-04-27 DIAGNOSIS — H9192 Unspecified hearing loss, left ear: Secondary | ICD-10-CM | POA: Diagnosis not present

## 2017-05-03 ENCOUNTER — Other Ambulatory Visit: Payer: Self-pay | Admitting: Student in an Organized Health Care Education/Training Program

## 2017-05-03 DIAGNOSIS — M2550 Pain in unspecified joint: Secondary | ICD-10-CM

## 2017-05-04 ENCOUNTER — Encounter: Payer: Self-pay | Admitting: Student in an Organized Health Care Education/Training Program

## 2017-05-05 ENCOUNTER — Encounter: Payer: Self-pay | Admitting: Student in an Organized Health Care Education/Training Program

## 2017-05-05 ENCOUNTER — Encounter: Payer: Self-pay | Admitting: Family Medicine

## 2017-05-06 ENCOUNTER — Other Ambulatory Visit: Payer: Self-pay | Admitting: Family Medicine

## 2017-05-06 DIAGNOSIS — M2550 Pain in unspecified joint: Secondary | ICD-10-CM

## 2017-05-06 MED ORDER — OXYCODONE HCL 5 MG PO TABS: 5.0000 mg | ORAL_TABLET | Freq: Three times a day (TID) | ORAL | 0 refills | Status: DC | PRN

## 2017-05-11 ENCOUNTER — Ambulatory Visit: Payer: BLUE CROSS/BLUE SHIELD | Admitting: Family Medicine

## 2017-05-11 ENCOUNTER — Other Ambulatory Visit: Payer: Self-pay | Admitting: Family Medicine

## 2017-05-11 DIAGNOSIS — M2550 Pain in unspecified joint: Secondary | ICD-10-CM

## 2017-05-11 MED ORDER — OXYCODONE HCL 5 MG PO TABS: 5.0000 mg | ORAL_TABLET | Freq: Every day | ORAL | 0 refills | Status: DC

## 2017-05-19 ENCOUNTER — Ambulatory Visit: Payer: BLUE CROSS/BLUE SHIELD | Admitting: Family Medicine

## 2017-05-20 ENCOUNTER — Encounter: Payer: Self-pay | Admitting: Family Medicine

## 2017-05-20 ENCOUNTER — Ambulatory Visit (INDEPENDENT_AMBULATORY_CARE_PROVIDER_SITE_OTHER): Payer: BLUE CROSS/BLUE SHIELD | Admitting: Family Medicine

## 2017-05-20 VITALS — BP 114/82 | HR 101 | Temp 98.7°F | Ht 72.0 in | Wt 193.1 lb

## 2017-05-20 DIAGNOSIS — G44301 Post-traumatic headache, unspecified, intractable: Secondary | ICD-10-CM | POA: Diagnosis not present

## 2017-05-20 MED ORDER — TOPIRAMATE 25 MG PO TABS: 50.0000 mg | ORAL_TABLET | Freq: Two times a day (BID) | ORAL | 1 refills | Status: DC

## 2017-05-20 MED ORDER — TIZANIDINE HCL 4 MG PO TABS: 4.0000 mg | ORAL_TABLET | Freq: Two times a day (BID) | ORAL | 1 refills | Status: DC | PRN

## 2017-05-20 MED ORDER — OXYCODONE HCL 10 MG PO TABS: 5.0000 mg | ORAL_TABLET | Freq: Every day | ORAL | 0 refills | Status: DC

## 2017-05-20 MED ORDER — NORTRIPTYLINE HCL 50 MG PO CAPS: 50.0000 mg | ORAL_CAPSULE | Freq: Every day | ORAL | 5 refills | Status: DC

## 2017-05-20 NOTE — Patient Instructions (Addendum)
Heat (pad or rice pillow in microwave) over affected area, 10-15 minutes twice daily.   Keep doing stretches and exercises for your neck.  If you do not hear anything about your referral in the next 1-2 weeks, call our office and ask for an update.  Let us know if you need anything.
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
## 2017-05-20 NOTE — Progress Notes (Signed)
Pre visit review using our clinic review tool, if applicable. No additional management support is needed unless otherwise documented below in the visit note. 

## 2017-05-23 ENCOUNTER — Other Ambulatory Visit: Payer: Self-pay | Admitting: Family Medicine

## 2017-05-23 DIAGNOSIS — M2550 Pain in unspecified joint: Secondary | ICD-10-CM

## 2017-06-04 ENCOUNTER — Encounter: Payer: Self-pay | Admitting: Family Medicine

## 2017-06-10 ENCOUNTER — Encounter: Payer: Self-pay | Admitting: Family Medicine

## 2017-06-10 ENCOUNTER — Ambulatory Visit (INDEPENDENT_AMBULATORY_CARE_PROVIDER_SITE_OTHER): Payer: BLUE CROSS/BLUE SHIELD | Admitting: Family Medicine

## 2017-06-10 VITALS — BP 120/72 | HR 122 | Temp 98.8°F | Ht 72.0 in | Wt 193.4 lb

## 2017-06-10 DIAGNOSIS — R9089 Other abnormal findings on diagnostic imaging of central nervous system: Secondary | ICD-10-CM

## 2017-06-10 DIAGNOSIS — R1011 Right upper quadrant pain: Secondary | ICD-10-CM

## 2017-06-10 DIAGNOSIS — M461 Sacroiliitis, not elsewhere classified: Secondary | ICD-10-CM

## 2017-06-10 LAB — LIPID PANEL
CHOL/HDL RATIO: 3
CHOLESTEROL: 125 mg/dL (ref 0–200)
HDL: 39.1 mg/dL (ref >39.00)
LDL CALC: 65 mg/dL (ref 0–99)
NONHDL: 86.04
Triglycerides: 104 mg/dL (ref 0.0–149.0)
VLDL: 20.8 mg/dL (ref 0.0–40.0)

## 2017-06-10 LAB — COMPREHENSIVE METABOLIC PANEL
ALBUMIN: 3.9 g/dL (ref 3.5–5.2)
ALT: 7 U/L (ref 0–35)
AST: 10 U/L (ref 0–37)
Alkaline Phosphatase: 72 U/L (ref 39–117)
BUN: 8 mg/dL (ref 6–23)
CHLORIDE: 105 meq/L (ref 96–112)
CO2: 27 mEq/L (ref 19–32)
CREATININE: 0.87 mg/dL (ref 0.40–1.20)
Calcium: 9.2 mg/dL (ref 8.4–10.5)
GFR: 76.83 mL/min (ref >60.00)
GLUCOSE: 116 mg/dL — AB (ref 70–99)
Potassium: 4.5 mEq/L (ref 3.5–5.1)
SODIUM: 139 meq/L (ref 135–145)
Total Bilirubin: 0.4 mg/dL (ref 0.2–1.2)
Total Protein: 6.9 g/dL (ref 6.0–8.3)

## 2017-06-10 LAB — CBC WITH DIFFERENTIAL/PLATELET
BASOS ABS: 0 10*3/uL (ref 0.0–0.1)
BASOS PCT: 0.3 % (ref 0.0–3.0)
EOS ABS: 0.5 10*3/uL (ref 0.0–0.7)
Eosinophils Relative: 3.8 % (ref 0.0–5.0)
HEMATOCRIT: 42.8 % (ref 36.0–46.0)
HEMOGLOBIN: 14 g/dL (ref 12.0–15.0)
LYMPHS PCT: 15.8 % (ref 12.0–46.0)
Lymphs Abs: 1.9 10*3/uL (ref 0.7–4.0)
MCHC: 32.8 g/dL (ref 30.0–36.0)
MCV: 86.8 fl (ref 78.0–100.0)
MONO ABS: 0.5 10*3/uL (ref 0.1–1.0)
Monocytes Relative: 4.2 % (ref 3.0–12.0)
Neutro Abs: 9.1 10*3/uL — ABNORMAL HIGH (ref 1.4–7.7)
Neutrophils Relative %: 75.9 % (ref 43.0–77.0)
Platelets: 281 10*3/uL (ref 150.0–400.0)
RBC: 4.93 Mil/uL (ref 3.87–5.11)
RDW: 13.3 % (ref 11.5–15.5)
WBC: 12 10*3/uL — AB (ref 4.0–10.5)

## 2017-06-10 MED ORDER — ATORVASTATIN CALCIUM 40 MG PO TABS: 40.0000 mg | ORAL_TABLET | Freq: Every day | ORAL | 3 refills | Status: DC

## 2017-06-10 MED ORDER — TRAMADOL HCL 50 MG PO TABS: 100.0000 mg | ORAL_TABLET | Freq: Two times a day (BID) | ORAL | 2 refills | Status: DC | PRN

## 2017-06-10 MED ORDER — PROMETHAZINE HCL 6.25 MG/5ML PO SYRP: 6.2500 mg | ORAL_SOLUTION | Freq: Four times a day (QID) | ORAL | 0 refills | Status: DC | PRN
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
## 2017-06-10 NOTE — Progress Notes (Signed)
Pre visit review using our clinic review tool, if applicable. No additional management support is needed unless otherwise documented below in the visit note. 

## 2017-06-10 NOTE — Patient Instructions (Addendum)
This may be a normal variant. We will treat you with the idea that you do have disease in the vessels.   Be fasting for your next set of labs. We will be in touch regarding your results.   Keep the diet clean and stay active.  Let us know if you need anything.

## 2017-06-11 ENCOUNTER — Other Ambulatory Visit: Payer: Self-pay | Admitting: Family Medicine

## 2017-06-11 DIAGNOSIS — M2550 Pain in unspecified joint: Secondary | ICD-10-CM

## 2017-06-12 ENCOUNTER — Other Ambulatory Visit: Payer: Self-pay | Admitting: Family Medicine

## 2017-06-12 DIAGNOSIS — R1011 Right upper quadrant pain: Secondary | ICD-10-CM

## 2017-06-17 ENCOUNTER — Ambulatory Visit (HOSPITAL_BASED_OUTPATIENT_CLINIC_OR_DEPARTMENT_OTHER)
Admission: RE | Admit: 2017-06-17 | Discharge: 2017-06-17 | Disposition: A | Discharge status: Home / Self Care | Payer: BLUE CROSS/BLUE SHIELD | Source: Ambulatory Visit | Attending: Family Medicine | Admitting: Family Medicine

## 2017-06-17 ENCOUNTER — Other Ambulatory Visit: Payer: Self-pay | Admitting: Family Medicine

## 2017-06-17 ENCOUNTER — Encounter (HOSPITAL_BASED_OUTPATIENT_CLINIC_OR_DEPARTMENT_OTHER): Payer: Self-pay

## 2017-06-17 ENCOUNTER — Encounter: Payer: Self-pay | Admitting: Family Medicine

## 2017-06-17 DIAGNOSIS — R1011 Right upper quadrant pain: Secondary | ICD-10-CM | POA: Insufficient documentation

## 2017-06-17 DIAGNOSIS — R112 Nausea with vomiting, unspecified: Secondary | ICD-10-CM | POA: Diagnosis not present

## 2017-06-17 MED ORDER — OXYCODONE HCL 10 MG PO TABS: 5.0000 mg | ORAL_TABLET | Freq: Every day | ORAL | 0 refills | Status: DC

## 2017-07-06 ENCOUNTER — Encounter: Payer: Self-pay | Admitting: Family Medicine

## 2017-07-06 ENCOUNTER — Ambulatory Visit (INDEPENDENT_AMBULATORY_CARE_PROVIDER_SITE_OTHER): Payer: BLUE CROSS/BLUE SHIELD | Admitting: Family Medicine

## 2017-07-06 VITALS — BP 118/70 | HR 96 | Temp 98.0°F | Ht 72.0 in | Wt 189.5 lb

## 2017-07-06 DIAGNOSIS — R739 Hyperglycemia, unspecified: Secondary | ICD-10-CM

## 2017-07-06 DIAGNOSIS — M461 Sacroiliitis, not elsewhere classified: Secondary | ICD-10-CM

## 2017-07-06 LAB — HEMOGLOBIN A1C: Hgb A1c MFr Bld: 5.7 % (ref 4.6–6.5)

## 2017-07-06 MED ORDER — METHYLPREDNISOLONE ACETATE 40 MG/ML IJ SUSP
40.0000 mg | Freq: Once | INTRAMUSCULAR | Status: AC
  Administered 2017-07-06: 40 mg via INTRA_ARTICULAR

## 2017-07-06 NOTE — Progress Notes (Signed)
Pre visit review using our clinic review tool, if applicable. No additional management support is needed unless otherwise documented below in the visit note. 

## 2017-07-06 NOTE — Patient Instructions (Signed)
1-2 days to get the results of your labs back.  Great work with your dietary improvements!  Let us know if you need anything.

## 2017-07-06 NOTE — Progress Notes (Signed)
Chief Complaint  Patient presents with  . Follow-up    Subjective: Patient is a 42 y.o. female here for f/u labs.  Hyperglycemia found on last blood work.  She is clean up her diet and cannot sugar.  She has lost weight and feels much better.  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  Her last injection was at least 4 months ago.  She has not been able to get in with the pain management team because of insurance issues.   ROS: MSK: +SI jt pain  Past Medical History:  Diagnosis Date  . Allergy   . Depression   . Eating disorder   . Fibromyalgia   . Hay fever   . History of chicken pox   . History of fainting spells of unknown cause   . Migraine   . Osteoarthritis   . Osteopenia   . Sacroiliitis (HCC)   . Scheurmann's disease     Objective: BP 118/70 (BP Location: Left Arm, Patient Position: Sitting, Cuff Size: Large)   Pulse 96   Temp 98 F (36.7 C) (Oral)   Ht 6' (1.829 m)   Wt 189 lb 8 oz (86 kg)   SpO2 97%   BMI 25.70 kg/m  General: Awake, appears stated age MSK: +TTP over L SI jt Lungs: No accessory muscle usei Psych: Age appropriate judgment and insight, normal affect and mood  Procedure note; SI joint injection Verbal consent obtained. The PSIS's were palpated and demarcated with an otoscope speculum tip just medial to the side of interest. The area was cleaned with alcohol. The joint was entered and 40 mg Depomedrol with 2 mL of 1% lidocaine was injected. The area was then bandaged. There were no complications noted.  The patient tolerated the procedure well.   Assessment and Plan: Sacroiliitis (HCC) - Plan: methylPREDNISolone acetate (DEPO-MEDROL) injection 40 mg, PR DRAIN/INJECT LARGE JOINT/BURSA  Hyperglycemia - Plan: Hemoglobin A1c  I will provide injection and pain medicine until she can get reestablished with her pain management provider. Check A1c.  Counseled on diet and exercise.  F/u prn pending above.  The patient voiced  understanding and agreement to the plan.  [name] [place], DO 07/06/17  12:02 PM

## 2017-07-10 ENCOUNTER — Other Ambulatory Visit: Payer: Self-pay | Admitting: Family Medicine

## 2017-07-14 ENCOUNTER — Other Ambulatory Visit: Payer: Self-pay | Admitting: Family Medicine

## 2017-07-14 DIAGNOSIS — G44301 Post-traumatic headache, unspecified, intractable: Secondary | ICD-10-CM

## 2017-07-15 ENCOUNTER — Encounter: Payer: Self-pay | Admitting: Family Medicine

## 2017-07-15 ENCOUNTER — Ambulatory Visit (INDEPENDENT_AMBULATORY_CARE_PROVIDER_SITE_OTHER): Payer: BLUE CROSS/BLUE SHIELD | Admitting: Family Medicine

## 2017-07-15 VITALS — BP 132/95 | HR 104 | Temp 98.5°F | Resp 16 | Ht 72.0 in | Wt 189.4 lb

## 2017-07-15 DIAGNOSIS — H65191 Other acute nonsuppurative otitis media, right ear: Secondary | ICD-10-CM

## 2017-07-15 DIAGNOSIS — J029 Acute pharyngitis, unspecified: Secondary | ICD-10-CM | POA: Diagnosis not present

## 2017-07-15 LAB — POCT RAPID STREP A (OFFICE): Rapid Strep A Screen: NEGATIVE

## 2017-07-15 MED ORDER — PREDNISONE 20 MG PO TABS: 40.0000 mg | ORAL_TABLET | Freq: Every day | ORAL | 0 refills | Status: AC

## 2017-07-15 MED ORDER — OXYCODONE HCL 10 MG PO TABS: 5.0000 mg | ORAL_TABLET | Freq: Three times a day (TID) | ORAL | 0 refills | Status: DC | PRN

## 2017-07-15 MED ORDER — AMOXICILLIN 500 MG PO CAPS
1000.0000 mg | ORAL_CAPSULE | Freq: Every day | ORAL | 0 refills | Status: DC
Start: 2017-07-15 — End: 2017-07-27

## 2017-07-15 NOTE — Progress Notes (Signed)
SUBJECTIVE:   Gail Watkins is a 42 y.o. female presents to the clinic for:  Chief Complaint  Patient presents with  . Sore Throat    has blisters     Complains of sore throat for 6 days.  Other associated symptoms: subjective fever, sinus congestion, rhinorrhea, itchy watery eyes, ear pain, sore throat and cough.  Denies: ear drainage, wheezing and shortness of breath Sick Contacts: friend's son Therapy to date: none  Social History   Tobacco Use  Smoking Status Current Some Day Smoker  . Types: Cigarettes  . Start date: 1994  Smokeless Tobacco Never Used    ROS: Pertinent items are noted in HPI  Patient's medications, allergies, past medical, surgical, social and family histories were reviewed and updated as appropriate.  OBJECTIVE:  BP (!) 132/95   Pulse (!) 104   Temp 98.5 F (36.9 C) (Oral)   Resp 16   Ht 6' (1.829 m)   Wt 189 lb 6.4 oz (85.9 kg)   SpO2 100%   BMI 25.69 kg/m  General: Awake, alert, appearing stated age Eyes: conjunctivae and sclerae clear Ears: TM with serous fluid on R, neg on L Nose: no visible exudate Oropharynx: Erythematous without exudate, symmetric Neck: supple, +ttp over cerv LN on R Lungs: clear to auscultation, no wheezes, rales or rhonchi, symmetric air entry, normal effort Heart: rate and rhythm regular- HR approx 84 on my exam Skin:reveals no rash Psych: Age appropriate judgment and insight  ASSESSMENT/PLAN:  Sore throat - Plan: POCT rapid strep A, amoxicillin (AMOXIL) 500 MG capsule, Culture, Group A Strep  Acute effusion of right ear - Plan: predniSONE (DELTASONE) 20 MG tablet  Orders as above. Start tx, await cx. Continue to practice good hand hygiene and push fluids. Ibuprofen and acetaminophen for pain. Replace toothbrush after 24 hours of being on abx. F/u prn. Pt voiced understanding and agreement to the plan.  [name] [place], DO 07/15/17 12:02 PM

## 2017-07-15 NOTE — Patient Instructions (Addendum)
It takes around 2 days to get the culture back.   OK to take Tylenol 1000 mg (2 extra strength tabs) or 975 mg (3 regular strength tabs) every 6 hours as needed.  Continue to push fluids, practice good hand hygiene, and cover your mouth if you cough.  If you start having fevers, shaking or shortness of breath, seek immediate care.

## 2017-07-16 LAB — CULTURE, GROUP A STREP
MICRO NUMBER:: 90817008
SPECIMEN QUALITY:: ADEQUATE

## 2017-07-21 ENCOUNTER — Other Ambulatory Visit (INDEPENDENT_AMBULATORY_CARE_PROVIDER_SITE_OTHER): Payer: BLUE CROSS/BLUE SHIELD

## 2017-07-21 DIAGNOSIS — R9089 Other abnormal findings on diagnostic imaging of central nervous system: Secondary | ICD-10-CM

## 2017-07-21 LAB — COMPREHENSIVE METABOLIC PANEL
ALBUMIN: 3.6 g/dL (ref 3.5–5.2)
ALT: 9 U/L (ref 0–35)
AST: 8 U/L (ref 0–37)
Alkaline Phosphatase: 47 U/L (ref 39–117)
BILIRUBIN TOTAL: 0.2 mg/dL (ref 0.2–1.2)
BUN: 13 mg/dL (ref 6–23)
CALCIUM: 8.7 mg/dL (ref 8.4–10.5)
CO2: 24 mEq/L (ref 19–32)
CREATININE: 0.97 mg/dL (ref 0.40–1.20)
Chloride: 111 mEq/L (ref 96–112)
GFR: 67.73 mL/min (ref >60.00)
Glucose, Bld: 111 mg/dL — ABNORMAL HIGH (ref 70–99)
Potassium: 4.8 mEq/L (ref 3.5–5.1)
Sodium: 141 mEq/L (ref 135–145)
Total Protein: 6.3 g/dL (ref 6.0–8.3)

## 2017-07-21 LAB — LIPID PANEL
CHOLESTEROL: 84 mg/dL (ref 0–200)
HDL: 43.6 mg/dL (ref >39.00)
LDL Cholesterol: 21 mg/dL (ref 0–99)
NonHDL: 40.08
TRIGLYCERIDES: 96 mg/dL (ref 0.0–149.0)
Total CHOL/HDL Ratio: 2
VLDL: 19.2 mg/dL (ref 0.0–40.0)

## 2017-07-27 ENCOUNTER — Encounter

## 2017-07-27 ENCOUNTER — Encounter: Payer: Self-pay | Admitting: Diagnostic Neuroimaging

## 2017-07-27 ENCOUNTER — Ambulatory Visit (INDEPENDENT_AMBULATORY_CARE_PROVIDER_SITE_OTHER): Payer: BLUE CROSS/BLUE SHIELD | Admitting: Diagnostic Neuroimaging

## 2017-07-27 VITALS — BP 139/91 | HR 97 | Ht 72.0 in | Wt 191.0 lb

## 2017-07-27 DIAGNOSIS — G43109 Migraine with aura, not intractable, without status migrainosus: Secondary | ICD-10-CM

## 2017-07-27 DIAGNOSIS — G44309 Post-traumatic headache, unspecified, not intractable: Secondary | ICD-10-CM | POA: Diagnosis not present

## 2017-07-27 DIAGNOSIS — G44301 Post-traumatic headache, unspecified, intractable: Secondary | ICD-10-CM

## 2017-07-27 MED ORDER — TOPIRAMATE 100 MG PO TABS: 100.0000 mg | ORAL_TABLET | Freq: Two times a day (BID) | ORAL | 12 refills | Status: DC

## 2017-07-27 MED ORDER — RIZATRIPTAN BENZOATE 10 MG PO TBDP: 10.0000 mg | ORAL_TABLET | ORAL | 11 refills | Status: DC | PRN
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
## 2017-07-27 NOTE — Patient Instructions (Addendum)
MIGRAINE WITH AURA + POST-CONCUSSION HEADACHES - increase topiramate to 100mg  twice a day; drink plenty of water - rizatriptan 10mg  as needed for breakthrough headache; may repeat x 1 after 2 hours; max 2 tabs per day or 8 per month  - To prevent or relieve headaches, try the following:  Cool Compress. Lie down and place a cool compress on your head.   Avoid headache triggers. If certain foods or odors seem to have triggered your migraines in the past, avoid them. A headache diary might help you identify triggers.   Include physical activity in your daily routine.   Manage stress. Find healthy ways to cope with the stressors, such as delegating tasks on your to-do list.   Practice relaxation techniques. Try deep breathing, yoga, massage and visualization.   Eat regularly. Eating regularly scheduled meals and maintaining a healthy diet might help prevent headaches. Also, drink plenty of fluids.   Follow a regular sleep schedule. Sleep deprivation might contribute to headaches  Consider biofeedback. With this mind-body technique, you learn to control certain bodily functions - such as muscle tension, heart rate and blood pressure - to prevent headaches or reduce headache pain.

## 2017-08-07 ENCOUNTER — Other Ambulatory Visit: Payer: Self-pay | Admitting: Family Medicine

## 2017-08-11 ENCOUNTER — Other Ambulatory Visit: Payer: Self-pay | Admitting: Family Medicine

## 2017-08-11 ENCOUNTER — Encounter: Payer: Self-pay | Admitting: Family Medicine

## 2017-08-11 DIAGNOSIS — G44301 Post-traumatic headache, unspecified, intractable: Secondary | ICD-10-CM

## 2017-08-11 MED ORDER — TIZANIDINE HCL 4 MG PO TABS: ORAL_TABLET | ORAL | 0 refills | Status: DC

## 2017-08-11 MED ORDER — OXYCODONE HCL 10 MG PO TABS: 5.0000 mg | ORAL_TABLET | Freq: Three times a day (TID) | ORAL | 0 refills | Status: DC | PRN

## 2017-08-16 ENCOUNTER — Other Ambulatory Visit: Payer: Self-pay | Admitting: Family Medicine

## 2017-08-16 DIAGNOSIS — M2550 Pain in unspecified joint: Secondary | ICD-10-CM

## 2017-08-16 DIAGNOSIS — G44301 Post-traumatic headache, unspecified, intractable: Secondary | ICD-10-CM

## 2017-09-04 ENCOUNTER — Other Ambulatory Visit: Payer: Self-pay | Admitting: Family Medicine

## 2017-09-04 DIAGNOSIS — G44301 Post-traumatic headache, unspecified, intractable: Secondary | ICD-10-CM

## 2017-09-07 ENCOUNTER — Encounter: Payer: Self-pay | Admitting: Family Medicine

## 2017-09-08 ENCOUNTER — Encounter: Payer: Self-pay | Admitting: Family Medicine

## 2017-09-08 MED ORDER — OXYCODONE HCL 10 MG PO TABS: 5.0000 mg | ORAL_TABLET | Freq: Three times a day (TID) | ORAL | 0 refills | Status: DC | PRN

## 2017-09-08 NOTE — Telephone Encounter (Signed)
Last oxycodone RX: 08/11/17, #[id number] Last OV: 07/15/17 Next OV: none scheduled UDS: 02/12/17 w/Dr Cherylann Ratel. Not done in our clinic  CSC: not on file CSR: No discrepancies identified

## 2017-09-09 ENCOUNTER — Other Ambulatory Visit: Payer: Self-pay | Admitting: Family Medicine

## 2017-09-09 DIAGNOSIS — Z01419 Encounter for gynecological examination (general) (routine) without abnormal findings: Secondary | ICD-10-CM

## 2017-09-14 NOTE — Telephone Encounter (Signed)
LMVM for pt to return call.   

## 2017-09-15 NOTE — Telephone Encounter (Signed)
I called pt.  She stated she has a lot of stressors in her life, she may be moving to [place].  She lives in [place] and traveling is hard for her (she asked about resources,  for assisting her in transportation from county to county) for appt.  I referred her to [facility] to assist.   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 These consist of nods, usually happen at night,  neck jerking, L to R nodding and then had episode  when on phone with husband, she stopped talking (not responding).  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  Her migraines she states the maxalt works well, but only 9 per month.  I instructed these are not to be used daily, and she was aware. She is taking preventative topamax 100mg  po bid, and I confirmed her dosing.   Had episode of sleep paralysis (its like she cannot breathe, cant' take breath in).  I relayed to see pcp for this.  Her pcp takes care of all her meds now.  She does not see Pain Management now.  Did you want to adjust anything?

## 2017-09-16 ENCOUNTER — Encounter: Payer: Self-pay | Admitting: Family Medicine

## 2017-09-17 NOTE — Telephone Encounter (Signed)
I called pt.  I relayed that I had emailed x 2 but thought I would call to make appt.  I was able to WI 09-29-17 at 1100, arrive 1030.  She stated that should be ok.

## 2017-09-18 ENCOUNTER — Other Ambulatory Visit: Payer: Self-pay | Admitting: Family Medicine

## 2017-09-18 DIAGNOSIS — M2550 Pain in unspecified joint: Secondary | ICD-10-CM

## 2017-09-28 ENCOUNTER — Ambulatory Visit (INDEPENDENT_AMBULATORY_CARE_PROVIDER_SITE_OTHER): Payer: BLUE CROSS/BLUE SHIELD | Admitting: Family Medicine

## 2017-09-28 ENCOUNTER — Telehealth: Payer: Self-pay | Admitting: Family Medicine

## 2017-09-28 ENCOUNTER — Encounter: Payer: Self-pay | Admitting: Family Medicine

## 2017-09-28 ENCOUNTER — Other Ambulatory Visit: Payer: Self-pay | Admitting: Family Medicine

## 2017-09-28 VITALS — BP 117/82 | HR 103 | Temp 98.4°F | Ht 72.0 in | Wt 182.8 lb

## 2017-09-28 DIAGNOSIS — G44301 Post-traumatic headache, unspecified, intractable: Secondary | ICD-10-CM

## 2017-09-28 DIAGNOSIS — M25511 Pain in right shoulder: Secondary | ICD-10-CM | POA: Diagnosis not present

## 2017-09-28 MED ORDER — PREDNISONE 20 MG PO TABS: 40.0000 mg | ORAL_TABLET | Freq: Every day | ORAL | 0 refills | Status: AC

## 2017-09-28 MED ORDER — TIZANIDINE HCL 4 MG PO TABS: ORAL_TABLET | ORAL | 0 refills | Status: DC

## 2017-09-28 NOTE — Progress Notes (Signed)
Musculoskeletal Exam  Patient: Gail Watkins DOB: [birth year]  DOS: 09/28/2017  SUBJECTIVE:  Chief Complaint:   Chief Complaint  Patient presents with  . Shoulder Pain    c/o right shoulder pain. Pt states that shoulder popped and now it hurts to lift to rotate shoulder.     Gail Watkins is a 42 y.o.  female for evaluation and treatment of R shoulder pain.   Onset:  5 days ago. No inj or change in activity.  Location: Top or R shoulder Character:  sharp/stinging  Progression of issue:  is unchanged Associated symptoms: Swelling, bruising, tingling in fingers Treatment: to date has been rest.   Neurovascular symptoms: no  ROS: Musculoskeletal/Extremities: +R shoulder pain  Past Medical History:  Diagnosis Date  . AC (acromioclavicular) joint bone spurs, unspecified laterality    in neck  . Allergy   . Depression   . Eating disorder   . Fibromyalgia   . Hay fever   . History of chicken pox   . History of fainting spells of unknown cause   . Migraine   . Osteoarthritis   . Osteopenia   . Prediabetes   . PTSD (post-traumatic stress disorder)   . Sacroiliitis (HCC)   . Scheurmann's disease     Objective: VITAL SIGNS: BP 117/82 (BP Location: Left Arm, Patient Position: Sitting, Cuff Size: Normal)   Pulse (!) 103   Temp 98.4 F (36.9 C) (Oral)   Ht 6' (1.829 m)   Wt 182 lb 12.8 oz (82.9 kg)   SpO2 95%   BMI 24.79 kg/m  Constitutional: Well formed, well developed. No acute distress. Cardiovascular: Brisk cap refill Thorax & Lungs: No accessory muscle use Musculoskeletal: R shoulder.   Normal active range of motion: no.   Normal passive range of motion: no Tenderness to palpation: no Deformity: no Ecchymosis: no Tests positive: Neer's, Hawkins, Empty can Tests negative: Lift off, cross over, speed's Neurologic: Decreased R hand sensory function compared to L. No focal deficits noted. DTR's equal and symmetry in UE's. No clonus. Psychiatric: Normal mood.  Age appropriate judgment and insight. Alert & oriented x 3.    Procedure Note; Shoulder bursa injection Verbal consent obtained. The area was palpated, an area was marked just caudal to the acromion process laterally, and cleaned with alcohol x1. A 27-gauge needle was used to enter the joint laterally with ease. 40 mg of Depomedrol with 2 mL of 1% lidocaine was injected. The patient tolerated the procedure well. There were no complications noted.  Assessment:  Acute pain of right shoulder - Plan: predniSONE (DELTASONE) 20 MG tablet, PR DRAIN/INJECT LARGE JOINT/BURSA  Plan: Orders as above. Steroid PO given to help for flare. Tylenol, ice, stretches/exercises. F/u prn. The patient voiced understanding and agreement to the plan.   [name] [place], DO 09/28/17  12:24 PM

## 2017-09-28 NOTE — Telephone Encounter (Signed)
Tizanidine  4 mg  refill Last Refill:09/04/17 # 60 Last OV: 09/28/17 PCP: Gail Watkins  Pharmacy: [facility] # [phone]

## 2017-09-28 NOTE — Patient Instructions (Addendum)
Ice/cold pack over area for 10-15 min twice daily.  OK to take Tylenol 1000 mg (2 extra strength tabs) or 975 mg (3 regular strength tabs) every 6 hours as needed.  Start the steroid tomorrow to prevent a flare associated with increased volume and flare from the steroid injection.  EXERCISES  RANGE OF MOTION (ROM) AND STRETCHING EXERCISES These exercises may help you when beginning to rehabilitate your injury. While completing these exercises, remember:   Restoring tissue flexibility helps normal motion to return to the joints. This allows healthier, less painful movement and activity.  An effective stretch should be held for at least 30 seconds.  A stretch should never be painful. You should only feel a gentle lengthening or release in the stretched tissue.  ROM - Pendulum  Bend at the waist so that your right / left arm falls away from your body. Support yourself with your opposite hand on a solid surface, such as a table or a countertop.  Your right / left arm should be perpendicular to the ground. If it is not perpendicular, you need to lean over farther. Relax the muscles in your right / left arm and shoulder as much as possible.  Gently sway your hips and trunk so they move your right / left arm without any use of your right / left shoulder muscles.  Progress your movements so that your right / left arm moves side to side, then forward and backward, and finally, both clockwise and counterclockwise.  Complete 10-15 repetitions in each direction. Many people use this exercise to relieve discomfort in their shoulder as well as to gain range of motion. Repeat 2 times. Complete this exercise 3 times per week.  STRETCH - Flexion, Standing  Stand with good posture. With an underhand grip on your right / left hand and an overhand grip on the opposite hand, grasp a broomstick or cane so that your hands are a little more than shoulder-width apart.  Keeping your right / left elbow  straight and shoulder muscles relaxed, push the stick with your opposite hand to raise your right / left arm in front of your body and then overhead. Raise your arm until you feel a stretch in your right / left shoulder, but before you have increased shoulder pain.  Try to avoid shrugging your right / left shoulder as your arm rises by keeping your shoulder blade tucked down and toward your mid-back spine. Hold 30 seconds.  Slowly return to the starting position. Repeat 2 times. Complete this exercise 3 times per week.  STRETCH - Internal Rotation  Place your right / left hand behind your back, palm-up.  Throw a towel or belt over your opposite shoulder. Grasp the towel/belt with your right / left hand.  While keeping an upright posture, gently pull up on the towel/belt until you feel a stretch in the front of your right / left shoulder.  Avoid shrugging your right / left shoulder as your arm rises by keeping your shoulder blade tucked down and toward your mid-back spine.  Hold 30. Release the stretch by lowering your opposite hand. Repeat 2 times. Complete this exercise 3 times per week.  STRETCH - External Rotation and Abduction  Stagger your stance through a doorframe. It does not matter which foot is forward.  As instructed by your physician, physical therapist or athletic trainer, place your hands: ? And forearms above your head and on the door frame. ? And forearms at head-height and on the door  frame. ? At elbow-height and on the door frame.  Keeping your head and chest upright and your stomach muscles tight to prevent over-extending your low-back, slowly shift your weight onto your front foot until you feel a stretch across your chest and/or in the front of your shoulders.  Hold 30 seconds. Shift your weight to your back foot to release the stretch. Repeat 2 times. Complete this stretch 3 times per week.   STRENGTHENING EXERCISES  These exercises may help you when beginning  to rehabilitate your injury. They may resolve your symptoms with or without further involvement from your physician, physical therapist or athletic trainer. While completing these exercises, remember:   Muscles can gain both the endurance and the strength needed for everyday activities through controlled exercises.  Complete these exercises as instructed by your physician, physical therapist or athletic trainer. Progress the resistance and repetitions only as guided.  You may experience muscle soreness or fatigue, but the pain or discomfort you are trying to eliminate should never worsen during these exercises. If this pain does worsen, stop and make certain you are following the directions exactly. If the pain is still present after adjustments, discontinue the exercise until you can discuss the trouble with your clinician.  If advised by your physician, during your recovery, avoid activity or exercises which involve actions that place your right / left hand or elbow above your head or behind your back or head. These positions stress the tissues which are trying to heal.  STRENGTH - Scapular Depression and Adduction  With good posture, sit on a firm chair. Supported your arms in front of you with pillows, arm rests or a table top. Have your elbows in line with the sides of your body.  Gently draw your shoulder blades down and toward your mid-back spine. Gradually increase the tension without tensing the muscles along the top of your shoulders and the back of your neck.  Hold for 3 seconds. Slowly release the tension and relax your muscles completely before completing the next repetition.  After you have practiced this exercise, remove the arm support and complete it in standing as well as sitting. Repeat 2 times. Complete this exercise 3 times per week.   STRENGTH - External Rotators  Secure a rubber exercise band/tubing to a fixed object so that it is at the same height as your right / left  elbow when you are standing or sitting on a firm surface.  Stand or sit so that the secured exercise band/tubing is at your side that is not injured.  Bend your elbow 90 degrees. Place a folded towel or small pillow under your right / left arm so that your elbow is a few inches away from your side.  Keeping the tension on the exercise band/tubing, pull it away from your body, as if pivoting on your elbow. Be sure to keep your body steady so that the movement is only coming from your shoulder rotating.  Hold 3 seconds. Release the tension in a controlled manner as you return to the starting position. Repeat 2 times. Complete this exercise 3 times per week.   STRENGTH - Supraspinatus  Stand or sit with good posture. Grasp a 2-3 lb weight or an exercise band/tubing so that your hand is "thumbs-up," like when you shake hands.  Slowly lift your right / left hand from your thigh into the air, traveling about 30 degrees from straight out at your side. Lift your hand to shoulder height or as  far as you can without increasing any shoulder pain. Initially, many people do not lift their hands above shoulder height.  Avoid shrugging your right / left shoulder as your arm rises by keeping your shoulder blade tucked down and toward your mid-back spine.  Hold for 3 seconds. Control the descent of your hand as you slowly return to your starting position. Repeat 2 times. Complete this exercise 3 times per week.   STRENGTH - Shoulder Extensors  Secure a rubber exercise band/tubing so that it is at the height of your shoulders when you are either standing or sitting on a firm arm-less chair.  With a thumbs-up grip, grasp an end of the band/tubing in each hand. Straighten your elbows and lift your hands straight in front of you at shoulder height. Step back away from the secured end of band/tubing until it becomes tense.  Squeezing your shoulder blades together, pull your hands down to the sides of your  thighs. Do not allow your hands to go behind you.  Hold for 3 seconds. Slowly ease the tension on the band/tubing as you reverse the directions and return to the starting position. Repeat 2 times. Complete this exercise 3 times per week.   STRENGTH - Scapular Retractors  Secure a rubber exercise band/tubing so that it is at the height of your shoulders when you are either standing or sitting on a firm arm-less chair.  With a palm-down grip, grasp an end of the band/tubing in each hand. Straighten your elbows and lift your hands straight in front of you at shoulder height. Step back away from the secured end of band/tubing until it becomes tense.  Squeezing your shoulder blades together, draw your elbows back as you bend them. Keep your upper arm lifted away from your body throughout the exercise.  Hold 3 seconds. Slowly ease the tension on the band/tubing as you reverse the directions and return to the starting position. Repeat 2 times. Complete this exercise 3 times per week.  STRENGTH - Scapular Depressors  Find a sturdy chair without wheels, such as a from a dining room table.  Keeping your feet on the floor, lift your bottom from the seat and lock your elbows.  Keeping your elbows straight, allow gravity to pull your body weight down. Your shoulders will rise toward your ears.  Raise your body against gravity by drawing your shoulder blades down your back, shortening the distance between your shoulders and ears. Although your feet should always maintain contact with the floor, your feet should progressively support less body weight as you get stronger.  Hold 3 seconds. In a controlled and slow manner, lower your body weight to begin the next repetition. Repeat 2 times. Complete this exercise 3 times per week.   This information is not intended to replace advice given to you by your health care provider. Make sure you discuss any questions you have with your health care  provider.  Document Released: 11/06/2004 Document Revised: 01/13/2014 Document Reviewed: 04/06/2008 Elsevier Interactive Patient Education [employer]

## 2017-09-28 NOTE — Telephone Encounter (Signed)
Copied from CRM [id number]. Topic: Quick Communication - Rx Refill/Question >> Sep 28, 2017  4:27 PM Laural BenesJohnson, Louisianahiquita C wrote: Medication: tiZANidine (ZANAFLEX) 4 MG tablet   Has the patient contacted their pharmacy? Yes  (Agent: If no, request that the patient contact the pharmacy for the refill.) (Agent: If yes, when and what did the pharmacy advise?)  Preferred Pharmacy (with phone number or street name): [id number] - [place], [place] - [address] [facility] [address]  Agent: Please be advised that RX refills may take up to 3 business days. We ask that you follow-up with your pharmacy.

## 2017-09-29 ENCOUNTER — Telehealth: Payer: Self-pay | Admitting: *Deleted

## 2017-09-29 ENCOUNTER — Ambulatory Visit: Payer: Self-pay | Admitting: Diagnostic Neuroimaging

## 2017-09-29 NOTE — Telephone Encounter (Signed)
Pt called to let us know that she would not be coming today for her appt.  (less then 24hours NO SHOW).

## 2017-10-03 ENCOUNTER — Encounter: Payer: Self-pay | Admitting: Family Medicine

## 2017-10-05 ENCOUNTER — Encounter: Payer: Self-pay | Admitting: Family Medicine

## 2017-10-05 ENCOUNTER — Other Ambulatory Visit: Payer: Self-pay | Admitting: Family Medicine

## 2017-10-05 DIAGNOSIS — M2550 Pain in unspecified joint: Secondary | ICD-10-CM

## 2017-10-05 DIAGNOSIS — G44301 Post-traumatic headache, unspecified, intractable: Secondary | ICD-10-CM

## 2017-10-05 MED ORDER — OXYCODONE HCL 10 MG PO TABS: 5.0000 mg | ORAL_TABLET | Freq: Three times a day (TID) | ORAL | 0 refills | Status: DC | PRN

## 2017-10-05 MED ORDER — DICYCLOMINE HCL 10 MG PO CAPS: ORAL_CAPSULE | ORAL | 0 refills | Status: DC

## 2017-10-05 MED ORDER — MELOXICAM 15 MG PO TABS: ORAL_TABLET | ORAL | 0 refills | Status: DC

## 2017-10-05 MED ORDER — OMEPRAZOLE 40 MG PO CPDR: 40.0000 mg | DELAYED_RELEASE_CAPSULE | Freq: Every day | ORAL | 5 refills | Status: DC

## 2017-10-07 ENCOUNTER — Other Ambulatory Visit: Payer: Self-pay | Admitting: Family Medicine

## 2017-10-13 ENCOUNTER — Encounter: Payer: Self-pay | Admitting: Family Medicine

## 2017-10-13 DIAGNOSIS — G44301 Post-traumatic headache, unspecified, intractable: Secondary | ICD-10-CM

## 2017-10-13 MED ORDER — TIZANIDINE HCL 4 MG PO TABS: ORAL_TABLET | ORAL | 0 refills | Status: DC

## 2017-10-16 ENCOUNTER — Ambulatory Visit: Payer: Self-pay | Admitting: Obstetrics & Gynecology

## 2017-10-16 ENCOUNTER — Other Ambulatory Visit (HOSPITAL_COMMUNITY)
Admission: RE | Admit: 2017-10-16 | Discharge: 2017-10-16 | Disposition: A | Discharge status: Home / Self Care | Payer: BLUE CROSS/BLUE SHIELD | Source: Ambulatory Visit | Attending: Obstetrics & Gynecology | Admitting: Obstetrics & Gynecology

## 2017-10-16 ENCOUNTER — Ambulatory Visit (INDEPENDENT_AMBULATORY_CARE_PROVIDER_SITE_OTHER): Payer: BLUE CROSS/BLUE SHIELD | Admitting: Obstetrics & Gynecology

## 2017-10-16 ENCOUNTER — Encounter: Payer: Self-pay | Admitting: Obstetrics & Gynecology

## 2017-10-16 VITALS — BP 125/58 | HR 118 | Ht 72.0 in | Wt 182.1 lb

## 2017-10-16 DIAGNOSIS — Z720 Tobacco use: Secondary | ICD-10-CM

## 2017-10-16 DIAGNOSIS — N939 Abnormal uterine and vaginal bleeding, unspecified: Secondary | ICD-10-CM

## 2017-10-16 DIAGNOSIS — Z975 Presence of (intrauterine) contraceptive device: Secondary | ICD-10-CM | POA: Diagnosis not present

## 2017-10-16 DIAGNOSIS — Z01419 Encounter for gynecological examination (general) (routine) without abnormal findings: Secondary | ICD-10-CM

## 2017-10-16 DIAGNOSIS — F17210 Nicotine dependence, cigarettes, uncomplicated: Secondary | ICD-10-CM | POA: Diagnosis not present

## 2017-10-16 NOTE — Progress Notes (Signed)
Subjective:     Gail Watkins is a 42 y.o. female here for a routine exam. [id number] SVD x1 c/s x2 LMP 10/8. Periods lasting a long time even on OCPs. Wants a hysterectomy.  Current complaints: has a HA. Took rescue meds today.  Pt got married in 05/2016. Pt smokes 2 cigs per day.    Gynecologic History Patient's last menstrual period was 10/09/2017. Contraception: OCP (estrogen/progesterone) Last Pap: 2017. Results were: normal Last mammogram: n/a.   Obstetric History OB History  Gravida Para Term Preterm AB Living  6 3 3   3 3   SAB TAB Ectopic Multiple Live Births  2 1     3     # Outcome Date GA Lbr Len/2nd Weight Sex Delivery Anes PTL Lv  6 SAB 2018          5 Term 2008 [redacted]w[redacted]d   F CS-LTranv Spinal  LIV  4 TAB 2005          3 Term 2003 [redacted]w[redacted]d   F Vag-Spont None  LIV  2 SAB 2002          1 Term 1996 [redacted]w[redacted]d   M CS-LTranv EPI  LIV   The following portions of the patient's history were reviewed and updated as appropriate: allergies, current medications, past family history, past medical history, past social history, past surgical history and problem list.  Review of Systems Pertinent items are noted in HPI.    Objective:  BP (!) 125/58   Pulse (!) 118   Ht 6' (1.829 m)   Wt 182 lb 1.9 oz (82.6 kg)   LMP 10/09/2017   BMI 24.70 kg/m   General Appearance:    Alert, cooperative, no distress, appears stated age  Head:    Normocephalic, without obvious abnormality, atraumatic  Eyes:    conjunctiva/corneas clear, EOM's intact, both eyes  Ears:    Normal external ear canals, both ears  Nose:   Nares normal, septum midline, mucosa normal, no drainage    or sinus tenderness  Throat:   Lips, mucosa, and tongue normal; teeth and gums normal  Neck:   Supple, symmetrical, trachea midline, no adenopathy;    thyroid:  no enlargement/tenderness/nodules  Back:     Symmetric, no curvature, ROM normal, no CVA tenderness  Lungs:     Clear to auscultation bilaterally, respirations unlabored   Chest Wall:    No tenderness or deformity   Heart:    Regular rate and rhythm, S1 and S2 normal, no murmur, rub   or gallop  Breast Exam:    No tenderness, masses, or nipple abnormality  Abdomen:     Soft, non-tender, bowel sounds active all four quadrants,    no masses, no organomegaly  Genitalia:    Normal female without lesion, discharge or tenderness     Extremities:   Extremities normal, atraumatic, no cyanosis or edema  Pulses:   2+ and symmetric all extremities  Skin:   Skin color, texture, turgor normal, no rashes or lesions     Assessment:    Healthy female exam.   AUB- not controlled with low dose OCPs. Pt wants a hyst. I have reviewed with her conservative tx options . Reviewed with pt safety and effectiveness of LnIUD. Pt wants to f/u fo IUD    +TOB use on OCPs. D/w pt the increased risk of CVA with OCPs and tob use in pts >35 years.    Plan:    Follow up in: 1 year.  Cont low dose OCPs until LnIUD placed in 2 weeks F/u for LnIUD Labs: CBC and TSH F/u PAP and cx.  Constantin Hillery L. Harraway-Smith, M.D., [place]

## 2017-10-16 NOTE — Patient Instructions (Signed)

## 2017-10-19 ENCOUNTER — Encounter: Payer: Self-pay | Admitting: Family Medicine

## 2017-10-19 NOTE — Telephone Encounter (Signed)
I would recommend OV please.  Dr.  Carmelia Roller is out of the office.

## 2017-10-20 LAB — CYTOLOGY - PAP
CHLAMYDIA, DNA PROBE: NEGATIVE
Diagnosis: NEGATIVE
HPV (WINDOPATH): NOT DETECTED
Neisseria Gonorrhea: NEGATIVE

## 2017-10-22 ENCOUNTER — Ambulatory Visit (INDEPENDENT_AMBULATORY_CARE_PROVIDER_SITE_OTHER): Payer: BLUE CROSS/BLUE SHIELD | Admitting: Family Medicine

## 2017-10-22 ENCOUNTER — Encounter: Payer: Self-pay | Admitting: Family Medicine

## 2017-10-22 VITALS — BP 132/88 | HR 120 | Temp 98.1°F | Ht 72.0 in | Wt 186.1 lb

## 2017-10-22 DIAGNOSIS — L089 Local infection of the skin and subcutaneous tissue, unspecified: Secondary | ICD-10-CM | POA: Diagnosis not present

## 2017-10-22 MED ORDER — CEPHALEXIN 500 MG PO CAPS: 500.0000 mg | ORAL_CAPSULE | Freq: Three times a day (TID) | ORAL | 0 refills | Status: AC

## 2017-10-22 NOTE — Progress Notes (Signed)
Pre visit review using our clinic review tool, if applicable. No additional management support is needed unless otherwise documented below in the visit note. 

## 2017-10-22 NOTE — Patient Instructions (Addendum)
Keep the area clean and dry.   Things to look out for: increasing pain not relieved by ibuprofen/acetaminophen, fevers, spreading redness, drainage of pus, or foul odor.  Let us know if you need anything.  

## 2017-10-22 NOTE — Progress Notes (Signed)
Chief Complaint  Patient presents with  . Insect Bite    left leg    Gail Watkins is a 41 y.o. female here for a skin complaint.  Duration: 5 days Location: L leg Pruritic? No Painful? Yes Drainage? Yes Bitten by fire ant. Other associated symptoms: Scabbed over once Therapies tried thus far: none  ROS:  Const: No fevers Skin: As noted in HPI  Past Medical History:  Diagnosis Date  . AC (acromioclavicular) joint bone spurs, unspecified laterality    in neck  . Allergy   . Depression   . Eating disorder   . Fibromyalgia   . Hay fever   . History of chicken pox   . History of fainting spells of unknown cause   . Migraine   . Osteoarthritis   . Osteopenia   . Prediabetes   . PTSD (post-traumatic stress disorder)   . Sacroiliitis (HCC)   . Scheurmann's disease     BP 132/88 (BP Location: Left Arm, Patient Position: Sitting, Cuff Size: Normal)   Pulse (!) 120   Temp 98.1 F (36.7 C) (Oral)   Ht 6' (1.829 m)   Wt 186 lb 2 oz (84.4 kg)   LMP 10/09/2017   SpO2 94%   BMI 25.24 kg/m  Gen: awake, alert, appearing stated age Lungs: No accessory muscle use Skin: See below. +warmth, no fluctuance Psych: Age appropriate judgment and insight   LLE   Skin infection - Plan: cephALEXin (KEFLEX) 500 MG capsule  Orders as above. Warning signs and symptoms verbalized and written down in AVS.  F/u prn.  The patient voiced understanding and agreement to the plan.  Jilda Roche [place], DO 10/22/17 9:59 AM

## 2017-10-23 ENCOUNTER — Encounter: Payer: Self-pay | Admitting: Family Medicine

## 2017-10-28 ENCOUNTER — Encounter: Payer: Self-pay | Admitting: Family Medicine

## 2017-10-29 NOTE — Telephone Encounter (Signed)
Author phoned pt. To set up OV for wound. Appointment made for 1115AM. Pt. States her wound has a severe burning nerve sensation, especially when she removes bandage, and she has been using ointment on it, as well as oxycodone for pain. Pt. Admitted to taking her dog's rx for gabapentin (100mg ) as well to ease the burning sensation but "nothing is working". Routed to Dr. Carmelia Roller to advise on what to do in meantime.

## 2017-10-30 ENCOUNTER — Encounter: Payer: Self-pay | Admitting: Family Medicine

## 2017-10-30 ENCOUNTER — Ambulatory Visit (INDEPENDENT_AMBULATORY_CARE_PROVIDER_SITE_OTHER): Payer: BLUE CROSS/BLUE SHIELD | Admitting: Family Medicine

## 2017-10-30 VITALS — BP 128/88 | HR 129 | Temp 98.3°F | Ht 72.0 in | Wt 183.2 lb

## 2017-10-30 DIAGNOSIS — S81802A Unspecified open wound, left lower leg, initial encounter: Secondary | ICD-10-CM

## 2017-10-30 MED ORDER — OXYCODONE HCL 10 MG PO TABS: 5.0000 mg | ORAL_TABLET | Freq: Three times a day (TID) | ORAL | 0 refills | Status: DC | PRN

## 2017-10-30 MED ORDER — LIDOCAINE 4 % EX AERO: 1.0000 | INHALATION_SPRAY | Freq: Four times a day (QID) | CUTANEOUS | 1 refills | Status: AC | PRN

## 2017-10-30 MED ORDER — SILVER SULFADIAZINE 1 % EX CREA: 1.0000 "application " | TOPICAL_CREAM | Freq: Every day | CUTANEOUS | 0 refills | Status: DC

## 2017-10-30 MED ORDER — GABAPENTIN 100 MG PO CAPS: 100.0000 mg | ORAL_CAPSULE | Freq: Three times a day (TID) | ORAL | 3 refills | Status: DC

## 2017-10-30 NOTE — Patient Instructions (Signed)
Ice/cold pack over area for 10-15 min twice daily.  OK to take Tylenol 1000 mg (2 extra strength tabs) or 975 mg (3 regular strength tabs) every 6 hours as needed.  Ibuprofen 400-600 mg (2-3 over the counter strength tabs) every 6 hours as needed for pain.  If you do not hear anything about your referral in the next 1-2 weeks, call our office and ask for an update.  Apply the Silvadene daily.  Keep the area clean and dry.   Try to get pads with alginate in it.  Let us know if you need anything.

## 2017-10-30 NOTE — Progress Notes (Signed)
Chief Complaint  Patient presents with  . Leg Pain    left leg wound    Gail Watkins is a 42 y.o. female here for a skin complaint.  Duration: 13 days Location: LLE Pruritic? No Painful? Yes Drainage? Yes New soaps/lotions/topicals/detergents? No Therapies tried thus far: Doxy  ROS:  Const: No fevers Skin: As noted in HPI  Past Medical History:  Diagnosis Date  . AC (acromioclavicular) joint bone spurs, unspecified laterality    in neck  . Allergy   . Depression   . Eating disorder   . Fibromyalgia   . Hay fever   . History of chicken pox   . History of fainting spells of unknown cause   . Migraine   . Osteoarthritis   . Osteopenia   . Prediabetes   . PTSD (post-traumatic stress disorder)   . Sacroiliitis (HCC)   . Scheurmann's disease     BP 128/88 (BP Location: Left Arm, Patient Position: Sitting, Cuff Size: Large)   Pulse (!) 129   Temp 98.3 F (36.8 C) (Oral)   Ht 6' (1.829 m)   Wt 183 lb 4 oz (83.1 kg)   LMP 10/09/2017   SpO2 96%   BMI 24.85 kg/m  Gen: awake, alert, appearing stated age Lungs: No accessory muscle use Skin: See below. +TTP. No drainage,  erythema, fluctuance, excoriation Psych: Age appropriate judgment and insight   LLE   Wound of left lower extremity, initial encounter - Plan: Oxycodone HCl 10 MG TABS, gabapentin (NEURONTIN) 100 MG capsule, Lidocaine 4 % AERO, silver sulfADIAZINE (SILVADENE) 1 % cream, Ambulatory referral to Wound Clinic  Orders as above. Will try to refill oxy sooner. Get set up with would care. Try to get dressing pads with alginate. Pt states she has done well with topical silver nitrate in past despite sulfa allergy. F/u prn. The patient voiced understanding and agreement to the plan.  Jilda Roche [place], DO 10/30/17 12:04 PM

## 2017-10-30 NOTE — Progress Notes (Signed)
Pre visit review using our clinic review tool, if applicable. No additional management support is needed unless otherwise documented below in the visit note. 

## 2017-11-02 ENCOUNTER — Other Ambulatory Visit: Payer: Self-pay | Admitting: Family Medicine

## 2017-11-02 ENCOUNTER — Ambulatory Visit (INDEPENDENT_AMBULATORY_CARE_PROVIDER_SITE_OTHER): Payer: BLUE CROSS/BLUE SHIELD | Admitting: Obstetrics & Gynecology

## 2017-11-02 ENCOUNTER — Encounter: Payer: Self-pay | Admitting: Family Medicine

## 2017-11-02 ENCOUNTER — Encounter: Payer: Self-pay | Admitting: Obstetrics & Gynecology

## 2017-11-02 VITALS — BP 113/73 | HR 97 | Resp 18 | Ht 72.0 in | Wt 185.0 lb

## 2017-11-02 DIAGNOSIS — Z30430 Encounter for insertion of intrauterine contraceptive device: Secondary | ICD-10-CM

## 2017-11-02 DIAGNOSIS — Z3202 Encounter for pregnancy test, result negative: Secondary | ICD-10-CM | POA: Diagnosis not present

## 2017-11-02 DIAGNOSIS — G44301 Post-traumatic headache, unspecified, intractable: Secondary | ICD-10-CM

## 2017-11-02 DIAGNOSIS — N939 Abnormal uterine and vaginal bleeding, unspecified: Secondary | ICD-10-CM

## 2017-11-02 DIAGNOSIS — Z01818 Encounter for other preprocedural examination: Secondary | ICD-10-CM

## 2017-11-02 LAB — POCT URINE PREGNANCY: PREG TEST UR: NEGATIVE

## 2017-11-02 MED ORDER — LEVONORGESTREL 19.5 MCG/DAY IU IUD
INTRAUTERINE_SYSTEM | Freq: Once | INTRAUTERINE | Status: AC
  Administered 2017-11-02: 10:00:00 via INTRAUTERINE

## 2017-11-02 MED ORDER — TIZANIDINE HCL 4 MG PO TABS: ORAL_TABLET | ORAL | 1 refills | Status: DC

## 2017-11-02 NOTE — Addendum Note (Signed)
Addended by: Areta Haber B on: 11/02/2017 10:27 AM   Modules accepted: Orders

## 2017-11-02 NOTE — Progress Notes (Signed)
GYNECOLOGY CLINIC PROCEDURE NOTE  Gail Watkins is a 42 y.o. [phone] here for Liletta IUD insertion.has some AUB and wants this as treatment.   IUD Insertion Procedure Note Patient identified, informed consent performed.  Discussed risks of irregular bleeding, cramping, infection, malpositioning or misplacement of the IUD outside the uterus which may require further procedures. Time out was performed.  Urine pregnancy test negative.  Speculum placed in the vagina.  Cervix visualized.  Cleaned with Betadine x 2.  Grasped anteriorly with a single tooth tenaculum.  Uterus sounded to 9 cm. Liletta IUD placed per manufacturer's recommendations.  Strings trimmed to 3 cm. Tenaculum was removed, good hemostasis noted.  Patient tolerated procedure well.   Patient was given post-procedure instructions.  Patient was asked to follow up in 4 weeks for IUD check.  Pt did not get TSH and CBC after last visit,. Will go to lab today.  Jennah Satchell L. Harraway-Smith, M.D., [place]

## 2017-11-02 NOTE — Patient Instructions (Signed)
Intrauterine Device Insertion, Care After This sheet gives you information about how to care for yourself after your procedure. Your health care provider may also give you more specific instructions. If you have problems or questions, contact your health care provider. What can I expect after the procedure? After the procedure, it is common to have:  Cramps and pain in the abdomen.  Light bleeding (spotting) or heavier bleeding that is like your menstrual period. This may last for up to a few days.  Lower back pain.  Dizziness.  Headaches.  Nausea.  Follow these instructions at home:  Before resuming sexual activity, check to make sure that you can feel the IUD string(s). You should be able to feel the end of the string(s) below the opening of your cervix. If your IUD string is in place, you may resume sexual activity. ? If you had a hormonal IUD inserted more than 7 days after your most recent period started, you will need to use a backup method of birth control for 7 days after IUD insertion. Ask your health care provider whether this applies to you.  Continue to check that the IUD is still in place by feeling for the string(s) after every menstrual period, or once a month.  Take over-the-counter and prescription medicines only as told by your health care provider.  Do not drive or use heavy machinery while taking prescription pain medicine.  Keep all follow-up visits as told by your health care provider. This is important. Contact a health care provider if:  You have bleeding that is heavier or lasts longer than a normal menstrual cycle.  You have a fever.  You have cramps or abdominal pain that get worse or do not get better with medicine.  You develop abdominal pain that is new or is not in the same area of earlier cramping and pain.  You feel lightheaded or weak.  You have abnormal or bad-smelling discharge from your vagina.  You have pain during sexual  activity.  You have any of the following problems with your IUD string(s): ? The string bothers or hurts you or your sexual partner. ? You cannot feel the string. ? The string has gotten longer.  You can feel the IUD in your vagina.  You think you may be pregnant, or you miss your menstrual period.  You think you may have an STI (sexually transmitted infection). Get help right away if:  You have flu-like symptoms.  You have a fever and chills.  You can feel that your IUD has slipped out of place. Summary  After the procedure, it is common to have cramps and pain in the abdomen. It is also common to have light bleeding (spotting) or heavier bleeding that is like your menstrual period.  Continue to check that the IUD is still in place by feeling for the string(s) after every menstrual period, or once a month.  Keep all follow-up visits as told by your health care provider. This is important.  Contact your health care provider if you have problems with your IUD string(s), such as the string getting longer or bothering you or your sexual partner. This information is not intended to replace advice given to you by your health care provider. Make sure you discuss any questions you have with your health care provider. Document Released: 08/21/2010 Document Revised: 11/14/2015 Document Reviewed: 11/14/2015 Elsevier Interactive Patient Education  2017 Else[employer]. Levonorgestrel intrauterine device (IUD) What is this medicine? LEVONORGESTREL IUD (LEE voe nor   jes trel) is a contraceptive (birth control) device. The device is placed inside the uterus by a healthcare professional. It is used to prevent pregnancy. This device can also be used to treat heavy bleeding that occurs during your period. This medicine may be used for other purposes; ask your health care provider or pharmacist if you have questions. COMMON BRAND NAME(S): Kyleena, LILETTA, Mirena, Skyla What should I tell my health  care provider before I take this medicine? They need to know if you have any of these conditions: -abnormal Pap smear -cancer of the breast, uterus, or cervix -diabetes -endometritis -genital or pelvic infection now or in the past -have more than one sexual partner or your partner has more than one partner -heart disease -history of an ectopic or tubal pregnancy -immune system problems -IUD in place -liver disease or tumor -problems with blood clots or take blood-thinners -seizures -use intravenous drugs -uterus of unusual shape -vaginal bleeding that has not been explained -an unusual or allergic reaction to levonorgestrel, other hormones, silicone, or polyethylene, medicines, foods, dyes, or preservatives -pregnant or trying to get pregnant -breast-feeding How should I use this medicine? This device is placed inside the uterus by a health care professional. Talk to your pediatrician regarding the use of this medicine in children. Special care may be needed. Overdosage: If you think you have taken too much of this medicine contact a poison control center or emergency room at once. NOTE: This medicine is only for you. Do not share this medicine with others. What if I miss a dose? This does not apply. Depending on the brand of device you have inserted, the device will need to be replaced every 3 to 5 years if you wish to continue using this type of birth control. What may interact with this medicine? Do not take this medicine with any of the following medications: -amprenavir -bosentan -fosamprenavir This medicine may also interact with the following medications: -aprepitant -armodafinil -barbiturate medicines for inducing sleep or treating seizures -bexarotene -boceprevir -griseofulvin -medicines to treat seizures like carbamazepine, ethotoin, felbamate, oxcarbazepine, phenytoin, topiramate -modafinil -pioglitazone -rifabutin -rifampin -rifapentine -some medicines to  treat HIV infection like atazanavir, efavirenz, indinavir, lopinavir, nelfinavir, tipranavir, ritonavir -St. John's wort -warfarin This list may not describe all possible interactions. Give your health care provider a list of all the medicines, herbs, non-prescription drugs, or dietary supplements you use. Also tell them if you smoke, drink alcohol, or use illegal drugs. Some items may interact with your medicine. What should I watch for while using this medicine? Visit your doctor or health care professional for regular check ups. See your doctor if you or your partner has sexual contact with others, becomes HIV positive, or gets a sexual transmitted disease. This product does not protect you against HIV infection (AIDS) or other sexually transmitted diseases. You can check the placement of the IUD yourself by reaching up to the top of your vagina with clean fingers to feel the threads. Do not pull on the threads. It is a good habit to check placement after each menstrual period. Call your doctor right away if you feel more of the IUD than just the threads or if you cannot feel the threads at all. The IUD may come out by itself. You may become pregnant if the device comes out. If you notice that the IUD has come out use a backup birth control method like condoms and call your health care provider. Using tampons will not change the position of the   IUD and are okay to use during your period. This IUD can be safely scanned with magnetic resonance imaging (MRI) only under specific conditions. Before you have an MRI, tell your healthcare provider that you have an IUD in place, and which type of IUD you have in place. What side effects may I notice from receiving this medicine? Side effects that you should report to your doctor or health care professional as soon as possible: -allergic reactions like skin rash, itching or hives, swelling of the face, lips, or tongue -fever, flu-like symptoms -genital  sores -high blood pressure -no menstrual period for 6 weeks during use -pain, swelling, warmth in the leg -pelvic pain or tenderness -severe or sudden headache -signs of pregnancy -stomach cramping -sudden shortness of breath -trouble with balance, talking, or walking -unusual vaginal bleeding, discharge -yellowing of the eyes or skin Side effects that usually do not require medical attention (report to your doctor or health care professional if they continue or are bothersome): -acne -breast pain -change in sex drive or performance -changes in weight -cramping, dizziness, or faintness while the device is being inserted -headache -irregular menstrual bleeding within first 3 to 6 months of use -nausea This list may not describe all possible side effects. Call your doctor for medical advice about side effects. You may report side effects to FDA at 1-800-FDA-1088. Where should I keep my medicine? This does not apply. NOTE: This sheet is a summary. It may not cover all possible information. If you have questions about this medicine, talk to your doctor, pharmacist, or health care provider.  2018 Elsevier/Gold Standard (2015-10-05 14:14:56)  

## 2017-11-03 LAB — CBC
HEMOGLOBIN: 12.6 g/dL (ref 11.1–15.9)
Hematocrit: 38.2 % (ref 34.0–46.6)
MCH: 30 pg (ref 26.6–33.0)
MCHC: 33 g/dL (ref 31.5–35.7)
MCV: 91 fL (ref 79–97)
Platelets: 252 10*3/uL (ref 150–450)
RBC: 4.2 x10E6/uL (ref 3.77–5.28)
RDW: 12.7 % (ref 12.3–15.4)
WBC: 10.1 10*3/uL (ref 3.4–10.8)

## 2017-11-03 LAB — TSH: TSH: 1.51 u[IU]/mL (ref 0.450–4.500)

## 2017-11-04 ENCOUNTER — Encounter: Payer: Self-pay | Admitting: Family Medicine

## 2017-11-04 DIAGNOSIS — G44301 Post-traumatic headache, unspecified, intractable: Secondary | ICD-10-CM

## 2017-11-04 MED ORDER — NORTRIPTYLINE HCL 50 MG PO CAPS: 50.0000 mg | ORAL_CAPSULE | Freq: Every day | ORAL | 5 refills | Status: DC

## 2017-11-11 ENCOUNTER — Encounter (HOSPITAL_BASED_OUTPATIENT_CLINIC_OR_DEPARTMENT_OTHER): Payer: BLUE CROSS/BLUE SHIELD | Attending: Internal Medicine

## 2017-11-11 DIAGNOSIS — L97822 Non-pressure chronic ulcer of other part of left lower leg with fat layer exposed: Secondary | ICD-10-CM | POA: Diagnosis not present

## 2017-11-11 DIAGNOSIS — F4312 Post-traumatic stress disorder, chronic: Secondary | ICD-10-CM | POA: Diagnosis not present

## 2017-11-11 DIAGNOSIS — M84752A Incomplete atypical femoral fracture, left leg, initial encounter for fracture: Secondary | ICD-10-CM | POA: Diagnosis not present

## 2017-11-11 DIAGNOSIS — F5000 Anorexia nervosa, unspecified: Secondary | ICD-10-CM | POA: Diagnosis not present

## 2017-11-11 DIAGNOSIS — Z6825 Body mass index (BMI) 25.0-25.9, adult: Secondary | ICD-10-CM | POA: Diagnosis not present

## 2017-11-11 DIAGNOSIS — F331 Major depressive disorder, recurrent, moderate: Secondary | ICD-10-CM | POA: Diagnosis not present

## 2017-11-11 DIAGNOSIS — I872 Venous insufficiency (chronic) (peripheral): Secondary | ICD-10-CM | POA: Insufficient documentation

## 2017-11-11 DIAGNOSIS — L97829 Non-pressure chronic ulcer of other part of left lower leg with unspecified severity: Secondary | ICD-10-CM | POA: Insufficient documentation

## 2017-11-11 DIAGNOSIS — F17210 Nicotine dependence, cigarettes, uncomplicated: Secondary | ICD-10-CM | POA: Diagnosis not present

## 2017-11-18 DIAGNOSIS — F4312 Post-traumatic stress disorder, chronic: Secondary | ICD-10-CM | POA: Diagnosis not present

## 2017-11-18 DIAGNOSIS — Z6825 Body mass index (BMI) 25.0-25.9, adult: Secondary | ICD-10-CM | POA: Diagnosis not present

## 2017-11-18 DIAGNOSIS — L97829 Non-pressure chronic ulcer of other part of left lower leg with unspecified severity: Secondary | ICD-10-CM | POA: Diagnosis not present

## 2017-11-18 DIAGNOSIS — F17210 Nicotine dependence, cigarettes, uncomplicated: Secondary | ICD-10-CM | POA: Diagnosis not present

## 2017-11-18 DIAGNOSIS — F331 Major depressive disorder, recurrent, moderate: Secondary | ICD-10-CM | POA: Diagnosis not present

## 2017-11-18 DIAGNOSIS — L97822 Non-pressure chronic ulcer of other part of left lower leg with fat layer exposed: Secondary | ICD-10-CM | POA: Diagnosis not present

## 2017-11-18 DIAGNOSIS — F5000 Anorexia nervosa, unspecified: Secondary | ICD-10-CM | POA: Diagnosis not present

## 2017-11-18 DIAGNOSIS — I872 Venous insufficiency (chronic) (peripheral): Secondary | ICD-10-CM | POA: Diagnosis not present

## 2017-11-23 ENCOUNTER — Other Ambulatory Visit: Payer: Self-pay | Admitting: Family Medicine

## 2017-11-23 DIAGNOSIS — M2550 Pain in unspecified joint: Secondary | ICD-10-CM

## 2017-11-23 MED ORDER — MELOXICAM 15 MG PO TABS: ORAL_TABLET | ORAL | 0 refills | Status: DC

## 2017-11-24 ENCOUNTER — Encounter: Payer: Self-pay | Admitting: Family Medicine

## 2017-11-24 DIAGNOSIS — S81802A Unspecified open wound, left lower leg, initial encounter: Secondary | ICD-10-CM

## 2017-11-25 DIAGNOSIS — F17210 Nicotine dependence, cigarettes, uncomplicated: Secondary | ICD-10-CM | POA: Diagnosis not present

## 2017-11-25 DIAGNOSIS — F4312 Post-traumatic stress disorder, chronic: Secondary | ICD-10-CM | POA: Diagnosis not present

## 2017-11-25 DIAGNOSIS — F5000 Anorexia nervosa, unspecified: Secondary | ICD-10-CM | POA: Diagnosis not present

## 2017-11-25 DIAGNOSIS — I872 Venous insufficiency (chronic) (peripheral): Secondary | ICD-10-CM | POA: Diagnosis not present

## 2017-11-25 DIAGNOSIS — F331 Major depressive disorder, recurrent, moderate: Secondary | ICD-10-CM | POA: Diagnosis not present

## 2017-11-25 DIAGNOSIS — L97822 Non-pressure chronic ulcer of other part of left lower leg with fat layer exposed: Secondary | ICD-10-CM | POA: Diagnosis not present

## 2017-11-25 DIAGNOSIS — L97829 Non-pressure chronic ulcer of other part of left lower leg with unspecified severity: Secondary | ICD-10-CM | POA: Diagnosis not present

## 2017-11-25 DIAGNOSIS — Z6825 Body mass index (BMI) 25.0-25.9, adult: Secondary | ICD-10-CM | POA: Diagnosis not present

## 2017-11-25 MED ORDER — GABAPENTIN 300 MG PO CAPS: 300.0000 mg | ORAL_CAPSULE | Freq: Three times a day (TID) | ORAL | 3 refills | Status: DC

## 2017-11-25 MED ORDER — OXYCODONE HCL 10 MG PO TABS: 5.0000 mg | ORAL_TABLET | Freq: Three times a day (TID) | ORAL | 0 refills | Status: DC | PRN

## 2017-11-30 ENCOUNTER — Ambulatory Visit (INDEPENDENT_AMBULATORY_CARE_PROVIDER_SITE_OTHER): Payer: BLUE CROSS/BLUE SHIELD | Admitting: Obstetrics & Gynecology

## 2017-11-30 ENCOUNTER — Encounter: Payer: Self-pay | Admitting: Obstetrics & Gynecology

## 2017-11-30 VITALS — BP 109/69 | HR 113 | Ht 72.0 in | Wt 190.0 lb

## 2017-11-30 DIAGNOSIS — Z30431 Encounter for routine checking of intrauterine contraceptive device: Secondary | ICD-10-CM | POA: Diagnosis not present

## 2017-11-30 NOTE — Progress Notes (Signed)
History:  42 y.o. [id number] here today for today for IUD string check; Liletta IUD was placed 10/28/20019. Pt reports light spotting but no concerning side effects. She is worried that she cannot have oral sex with the bleeding and wants to know her options.      The following portions of the patient's history were reviewed and updated as appropriate: allergies, current medications, past family history, past medical history, past social history, past surgical history and problem list. Last pap smear on 10/16/2017 was normal.  Review of Systems:  Pertinent items are noted in HPI.   Objective:  Physical Exam Blood pressure 109/69, pulse (!) 113, height 6' (1.829 m), weight 190 lb (86.2 kg). Gen: NAD Abd: Soft, nontender and nondistended Pelvic: Normal appearing external genitalia; normal appearing vaginal mucosa and cervix.  IUD strings visualized, about 3 cm in length outside cervix.   Assessment & Plan:  Normal IUD check. Patient to keep IUD in place for five years; can come in for removal if she desires pregnancy within the next five years. Routine preventative health maintenance measures emphasized.  Chelse Matas L. Harraway-Smith, M.D., [facility] CoreFACOG

## 2017-11-30 NOTE — Patient Instructions (Signed)

## 2017-12-02 DIAGNOSIS — F4312 Post-traumatic stress disorder, chronic: Secondary | ICD-10-CM | POA: Diagnosis not present

## 2017-12-02 DIAGNOSIS — L97822 Non-pressure chronic ulcer of other part of left lower leg with fat layer exposed: Secondary | ICD-10-CM | POA: Diagnosis not present

## 2017-12-02 DIAGNOSIS — Z6825 Body mass index (BMI) 25.0-25.9, adult: Secondary | ICD-10-CM | POA: Diagnosis not present

## 2017-12-02 DIAGNOSIS — F331 Major depressive disorder, recurrent, moderate: Secondary | ICD-10-CM | POA: Diagnosis not present

## 2017-12-02 DIAGNOSIS — M84752A Incomplete atypical femoral fracture, left leg, initial encounter for fracture: Secondary | ICD-10-CM | POA: Diagnosis not present

## 2017-12-02 DIAGNOSIS — I872 Venous insufficiency (chronic) (peripheral): Secondary | ICD-10-CM | POA: Diagnosis not present

## 2017-12-02 DIAGNOSIS — L97829 Non-pressure chronic ulcer of other part of left lower leg with unspecified severity: Secondary | ICD-10-CM | POA: Diagnosis not present

## 2017-12-02 DIAGNOSIS — F17210 Nicotine dependence, cigarettes, uncomplicated: Secondary | ICD-10-CM | POA: Diagnosis not present

## 2017-12-02 DIAGNOSIS — F5000 Anorexia nervosa, unspecified: Secondary | ICD-10-CM | POA: Diagnosis not present

## 2017-12-09 ENCOUNTER — Other Ambulatory Visit: Payer: Self-pay | Admitting: Family Medicine

## 2017-12-09 ENCOUNTER — Encounter (HOSPITAL_BASED_OUTPATIENT_CLINIC_OR_DEPARTMENT_OTHER): Payer: BLUE CROSS/BLUE SHIELD | Attending: Internal Medicine

## 2017-12-09 DIAGNOSIS — L97822 Non-pressure chronic ulcer of other part of left lower leg with fat layer exposed: Secondary | ICD-10-CM | POA: Insufficient documentation

## 2017-12-09 DIAGNOSIS — F17200 Nicotine dependence, unspecified, uncomplicated: Secondary | ICD-10-CM | POA: Diagnosis not present

## 2017-12-09 DIAGNOSIS — L97829 Non-pressure chronic ulcer of other part of left lower leg with unspecified severity: Secondary | ICD-10-CM | POA: Diagnosis not present

## 2017-12-09 DIAGNOSIS — G44301 Post-traumatic headache, unspecified, intractable: Secondary | ICD-10-CM

## 2017-12-09 DIAGNOSIS — Z9221 Personal history of antineoplastic chemotherapy: Secondary | ICD-10-CM | POA: Diagnosis not present

## 2017-12-09 MED ORDER — TIZANIDINE HCL 4 MG PO TABS: ORAL_TABLET | ORAL | 2 refills | Status: DC

## 2017-12-15 ENCOUNTER — Encounter: Payer: Self-pay | Admitting: Family Medicine

## 2017-12-16 ENCOUNTER — Encounter (HOSPITAL_BASED_OUTPATIENT_CLINIC_OR_DEPARTMENT_OTHER): Payer: BLUE CROSS/BLUE SHIELD | Attending: Physician Assistant

## 2017-12-16 DIAGNOSIS — Z9221 Personal history of antineoplastic chemotherapy: Secondary | ICD-10-CM | POA: Diagnosis not present

## 2017-12-16 DIAGNOSIS — F17200 Nicotine dependence, unspecified, uncomplicated: Secondary | ICD-10-CM | POA: Diagnosis not present

## 2017-12-16 DIAGNOSIS — L97822 Non-pressure chronic ulcer of other part of left lower leg with fat layer exposed: Secondary | ICD-10-CM | POA: Diagnosis not present

## 2017-12-23 ENCOUNTER — Encounter: Payer: Self-pay | Admitting: Family Medicine

## 2017-12-23 ENCOUNTER — Ambulatory Visit (INDEPENDENT_AMBULATORY_CARE_PROVIDER_SITE_OTHER): Payer: BLUE CROSS/BLUE SHIELD | Admitting: Family Medicine

## 2017-12-23 VITALS — BP 118/80 | HR 50 | Temp 98.2°F | Ht 72.0 in | Wt 188.1 lb

## 2017-12-23 DIAGNOSIS — Z23 Encounter for immunization: Secondary | ICD-10-CM

## 2017-12-23 DIAGNOSIS — G894 Chronic pain syndrome: Secondary | ICD-10-CM

## 2017-12-23 DIAGNOSIS — L97822 Non-pressure chronic ulcer of other part of left lower leg with fat layer exposed: Secondary | ICD-10-CM | POA: Diagnosis not present

## 2017-12-23 DIAGNOSIS — R197 Diarrhea, unspecified: Secondary | ICD-10-CM | POA: Diagnosis not present

## 2017-12-23 DIAGNOSIS — Z9221 Personal history of antineoplastic chemotherapy: Secondary | ICD-10-CM | POA: Diagnosis not present

## 2017-12-23 DIAGNOSIS — F17200 Nicotine dependence, unspecified, uncomplicated: Secondary | ICD-10-CM | POA: Diagnosis not present

## 2017-12-23 DIAGNOSIS — K529 Noninfective gastroenteritis and colitis, unspecified: Secondary | ICD-10-CM

## 2017-12-23 DIAGNOSIS — L97829 Non-pressure chronic ulcer of other part of left lower leg with unspecified severity: Secondary | ICD-10-CM | POA: Diagnosis not present

## 2017-12-23 LAB — POCT INFLUENZA A/B
Influenza A, POC: NEGATIVE
Influenza B, POC: NEGATIVE

## 2017-12-23 MED ORDER — PROMETHAZINE HCL 25 MG PO TABS: 25.0000 mg | ORAL_TABLET | Freq: Four times a day (QID) | ORAL | 0 refills | Status: DC | PRN

## 2017-12-23 MED ORDER — OXYCODONE HCL 10 MG PO TABS: 5.0000 mg | ORAL_TABLET | Freq: Three times a day (TID) | ORAL | 0 refills | Status: DC | PRN

## 2017-12-23 NOTE — Progress Notes (Signed)
Chief Complaint  Patient presents with  . Nausea  . Diarrhea  . Headache   Subjective Gail Watkins is a 42 y.o. female who presents with nausea and diarrhea Symptoms began 4-5 d ago. Patient has abdominal pain, vomiting, diarrhea, chills, sweats and nausea Patient denies cramping, fever and arthralgias Evaluation to date: Pedialyte Sick contacts: none known  Past Medical History:  Diagnosis Date  . AC (acromioclavicular) joint bone spurs, unspecified laterality    in neck  . Allergy   . Depression   . Eating disorder   . Fibromyalgia   . Hay fever   . History of chicken pox   . History of fainting spells of unknown cause   . Migraine   . Osteoarthritis   . Osteopenia   . Prediabetes   . PTSD (post-traumatic stress disorder)   . Sacroiliitis (HCC)   . Scheurmann's disease    Review of Systems Constitutional:  No fevers or chills Ear/Nose/Mouth/Throat:  No red eyes Gastrointestinal:  As noted in the HPI  Exam BP 118/80 (BP Location: Left Arm, Patient Position: Sitting, Cuff Size: Normal)   Pulse (!) 50   Temp 98.2 F (36.8 C) (Oral)   Ht 6' (1.829 m)   Wt 188 lb 2 oz (85.3 kg)   SpO2 94%   BMI 25.51 kg/m  General:  well developed, well hydrated, in no apparent distress Skin:  warm, no pallor or diaphoresis, no rashes Throat/Pharynx:  lips and gingiva without lesion; tongue and uvula midline; non-inflamed pharynx; no exudates or postnasal drainage Neck: neck supple without adenopathy, thyromegaly, or masses Lungs:  clear to auscultation, breath sounds equal bilaterally, no respiratory distress, no wheezes Cardio:  regular rate and rhythm without murmurs Abdomen:  abdomen soft, ttp in LLQ and centrally; bowel sounds normal; no masses or organomegaly Extremities:  no clubbing, cyanosis, or edema Psych: Age appropriate behavior and responsive to exam  Assessment and Plan  Gastroenteritis - Plan: promethazine (PHENERGAN) 25 MG tablet, POCT Influenza  A/B  Chronic pain syndrome - Plan: Oxycodone HCl 10 MG TABS  Diarrhea, unspecified type - Plan: Stool Culture, Ova and parasite examination, POCT Influenza A/B  Need for tetanus booster - Plan: Tdap vaccine greater than or equal to 7yo IM  Flu neg. Supportive care.  Check a stool culture if no improvement in 24 hours. Avoid aggravating foods, discussed BRAT diet. F/u if symptoms fail to improve, sooner if worsening. The patient voiced understanding and agreement to the plan.  [name] [place], DO 12/23/17  10:54 AM

## 2017-12-23 NOTE — Patient Instructions (Signed)
Keep pushing fluids.  No need to do sample if your diarrhea starts getting better.  Let us know if you need anything.

## 2017-12-23 NOTE — Progress Notes (Signed)
Pre visit review using our clinic review tool, if applicable. No additional management support is needed unless otherwise documented below in the visit note. 

## 2017-12-28 ENCOUNTER — Other Ambulatory Visit: Payer: Self-pay | Admitting: Family Medicine

## 2017-12-28 DIAGNOSIS — G44301 Post-traumatic headache, unspecified, intractable: Secondary | ICD-10-CM

## 2017-12-28 DIAGNOSIS — M2550 Pain in unspecified joint: Secondary | ICD-10-CM

## 2017-12-28 MED ORDER — MELOXICAM 15 MG PO TABS: ORAL_TABLET | ORAL | 0 refills | Status: DC

## 2017-12-28 MED ORDER — TOPIRAMATE 100 MG PO TABS: 100.0000 mg | ORAL_TABLET | Freq: Two times a day (BID) | ORAL | 0 refills | Status: DC

## 2018-01-19 ENCOUNTER — Encounter: Payer: Self-pay | Admitting: Family Medicine

## 2018-01-19 DIAGNOSIS — G894 Chronic pain syndrome: Secondary | ICD-10-CM

## 2018-01-19 MED ORDER — OXYCODONE HCL 10 MG PO TABS: 5.0000 mg | ORAL_TABLET | Freq: Three times a day (TID) | ORAL | 0 refills | Status: DC | PRN

## 2018-01-31 ENCOUNTER — Telehealth: Payer: Self-pay | Admitting: *Deleted

## 2018-01-31 NOTE — Telephone Encounter (Signed)
Called patient and informed her the NP is sick, need to reschedule her FU. She stated she wanted to be seen asap, and it needs to be on a Monday because her husband is off. Rescheduled her for Feb 3rd, advised she arrive 30 minutes early to check in. She verbalized understanding, appreciation.

## 2018-02-01 ENCOUNTER — Ambulatory Visit: Payer: Self-pay | Admitting: Family Medicine

## 2018-02-01 ENCOUNTER — Encounter: Payer: Self-pay | Admitting: Family Medicine

## 2018-02-01 ENCOUNTER — Ambulatory Visit: Payer: BLUE CROSS/BLUE SHIELD | Admitting: Adult Health

## 2018-02-01 ENCOUNTER — Other Ambulatory Visit: Payer: Self-pay | Admitting: Family Medicine

## 2018-02-01 ENCOUNTER — Ambulatory Visit (INDEPENDENT_AMBULATORY_CARE_PROVIDER_SITE_OTHER): Payer: BLUE CROSS/BLUE SHIELD | Admitting: Family Medicine

## 2018-02-01 VITALS — BP 120/78 | HR 133 | Temp 99.0°F | Ht 72.0 in | Wt 191.1 lb

## 2018-02-01 DIAGNOSIS — J40 Bronchitis, not specified as acute or chronic: Secondary | ICD-10-CM

## 2018-02-01 MED ORDER — BENZONATATE 100 MG PO CAPS: 100.0000 mg | ORAL_CAPSULE | Freq: Three times a day (TID) | ORAL | 0 refills | Status: DC | PRN

## 2018-02-01 MED ORDER — PREDNISONE 20 MG PO TABS: 40.0000 mg | ORAL_TABLET | Freq: Every day | ORAL | 0 refills | Status: AC

## 2018-02-01 MED ORDER — AZITHROMYCIN 250 MG PO TABS: ORAL_TABLET | ORAL | 0 refills | Status: DC

## 2018-02-01 NOTE — Progress Notes (Signed)
Chief Complaint  Patient presents with  . Cough    congestion    Linward Natal here for URI complaints.  Duration: 2 weeks  Associated symptoms: rhinorrhea, itchy watery eyes, ear fullness, sore throat and cough, chills Denies: sinus congestion, sinus pain, ear pain, ear drainage, shortness of breath, myalgia and fevers Treatment to date: OTC meds Sick contacts: No  ROS:  Const:+chills HEENT: As noted in HPI Lungs: +cough  Past Medical History:  Diagnosis Date  . AC (acromioclavicular) joint bone spurs, unspecified laterality    in neck  . Allergy   . Depression   . Eating disorder   . Fibromyalgia   . Hay fever   . History of chicken pox   . History of fainting spells of unknown cause   . Migraine   . Osteoarthritis   . Osteopenia   . Prediabetes   . PTSD (post-traumatic stress disorder)   . Sacroiliitis (HCC)   . Scheurmann's disease     BP 120/78 (BP Location: Left Arm, Patient Position: Sitting, Cuff Size: Normal)   Pulse (!) 133   Temp 99 F (37.2 C) (Oral)   Ht 6' (1.829 m)   Wt 191 lb 2 oz (86.7 kg)   SpO2 97%   BMI 25.92 kg/m  General: Awake, alert, appears stated age HEENT: AT, [place], ears patent b/l and TM's neg, nares patent w/o discharge, pharynx pink and without exudates, MMM Neck: No masses or asymmetry Heart: RRR Lungs: +diffuse wheezing, no accessory muscle use Psych: Age appropriate judgment and insight, normal mood and affect  Wheezy bronchitis - Plan: benzonatate (TESSALON) 100 MG capsule, azithromycin (ZITHROMAX) 250 MG tablet, predniSONE (DELTASONE) 20 MG tablet  Orders as above. Continue to push fluids, practice good hand hygiene, cover mouth when coughing. F/u prn. If starting to experience fevers, shaking, or shortness of breath, seek immediate care. Pt voiced understanding and agreement to the plan.  Jilda Roche [place], DO 02/01/18 10:58 AM

## 2018-02-01 NOTE — Patient Instructions (Signed)
Continue to push fluids, practice good hand hygiene, and cover your mouth if you cough.  If you start having fevers, shaking or shortness of breath, seek immediate care.  Let us know if you need anything.  

## 2018-02-03 ENCOUNTER — Other Ambulatory Visit: Payer: Self-pay | Admitting: Family Medicine

## 2018-02-03 DIAGNOSIS — M2550 Pain in unspecified joint: Secondary | ICD-10-CM

## 2018-02-03 DIAGNOSIS — G44301 Post-traumatic headache, unspecified, intractable: Secondary | ICD-10-CM

## 2018-02-03 MED ORDER — MELOXICAM 15 MG PO TABS: ORAL_TABLET | ORAL | 0 refills | Status: DC

## 2018-02-03 MED ORDER — TOPIRAMATE 100 MG PO TABS: 100.0000 mg | ORAL_TABLET | Freq: Two times a day (BID) | ORAL | 0 refills | Status: DC

## 2018-02-05 ENCOUNTER — Other Ambulatory Visit: Payer: Self-pay | Admitting: Family Medicine

## 2018-02-05 MED ORDER — DOXYCYCLINE HYCLATE 100 MG PO TABS: 100.0000 mg | ORAL_TABLET | Freq: Two times a day (BID) | ORAL | 0 refills | Status: DC

## 2018-02-06 DIAGNOSIS — J069 Acute upper respiratory infection, unspecified: Secondary | ICD-10-CM | POA: Diagnosis not present

## 2018-02-06 DIAGNOSIS — B9689 Other specified bacterial agents as the cause of diseases classified elsewhere: Secondary | ICD-10-CM | POA: Diagnosis not present

## 2018-02-06 DIAGNOSIS — R05 Cough: Secondary | ICD-10-CM | POA: Diagnosis not present

## 2018-02-06 DIAGNOSIS — R110 Nausea: Secondary | ICD-10-CM | POA: Diagnosis not present

## 2018-02-06 DIAGNOSIS — R51 Headache: Secondary | ICD-10-CM | POA: Diagnosis not present

## 2018-02-06 DIAGNOSIS — J4541 Moderate persistent asthma with (acute) exacerbation: Secondary | ICD-10-CM | POA: Diagnosis not present

## 2018-02-06 DIAGNOSIS — J45909 Unspecified asthma, uncomplicated: Secondary | ICD-10-CM | POA: Diagnosis not present

## 2018-02-06 DIAGNOSIS — F17210 Nicotine dependence, cigarettes, uncomplicated: Secondary | ICD-10-CM | POA: Diagnosis not present

## 2018-02-08 ENCOUNTER — Ambulatory Visit (INDEPENDENT_AMBULATORY_CARE_PROVIDER_SITE_OTHER): Payer: BLUE CROSS/BLUE SHIELD | Admitting: Diagnostic Neuroimaging

## 2018-02-08 ENCOUNTER — Telehealth: Payer: Self-pay | Admitting: *Deleted

## 2018-02-08 ENCOUNTER — Encounter: Payer: Self-pay | Admitting: Diagnostic Neuroimaging

## 2018-02-08 VITALS — BP 130/82 | HR 121 | Ht 72.0 in | Wt 191.4 lb

## 2018-02-08 DIAGNOSIS — G43109 Migraine with aura, not intractable, without status migrainosus: Secondary | ICD-10-CM

## 2018-02-08 DIAGNOSIS — G44309 Post-traumatic headache, unspecified, not intractable: Secondary | ICD-10-CM

## 2018-02-08 DIAGNOSIS — G44301 Post-traumatic headache, unspecified, intractable: Secondary | ICD-10-CM | POA: Diagnosis not present

## 2018-02-08 MED ORDER — TOPIRAMATE 100 MG PO TABS: 100.0000 mg | ORAL_TABLET | Freq: Two times a day (BID) | ORAL | 4 refills | Status: DC

## 2018-02-08 MED ORDER — RIZATRIPTAN BENZOATE 10 MG PO TBDP: 10.0000 mg | ORAL_TABLET | ORAL | 11 refills | Status: DC | PRN

## 2018-02-08 MED ORDER — ERENUMAB-AOOE 70 MG/ML ~~LOC~~ SOAJ: 70.0000 mg | SUBCUTANEOUS | 4 refills | Status: DC

## 2018-02-08 NOTE — Telephone Encounter (Signed)
Received fax from [employer], [facility] needs PA. Called [facility], spoke with [place].  The clinical questions were  answered. 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 tried/failed: topiramate, gabapentin, nortriptyline, depakote, amitriptyline, midrin, sumatriptan, fioricet.  Approved 02/08/18 - 08/09/2018. Case # pa [id number]. Approval faxed to [facility].
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
## 2018-02-15 ENCOUNTER — Encounter: Payer: Self-pay | Admitting: Family Medicine

## 2018-02-16 ENCOUNTER — Other Ambulatory Visit: Payer: Self-pay | Admitting: Family Medicine

## 2018-02-16 DIAGNOSIS — G894 Chronic pain syndrome: Secondary | ICD-10-CM

## 2018-02-16 MED ORDER — OXYCODONE HCL 10 MG PO TABS: 5.0000 mg | ORAL_TABLET | Freq: Three times a day (TID) | ORAL | 0 refills | Status: DC | PRN

## 2018-03-15 ENCOUNTER — Encounter: Payer: Self-pay | Admitting: Family Medicine

## 2018-03-15 DIAGNOSIS — G894 Chronic pain syndrome: Secondary | ICD-10-CM

## 2018-03-15 MED ORDER — OXYCODONE HCL 10 MG PO TABS: 5.0000 mg | ORAL_TABLET | Freq: Three times a day (TID) | ORAL | 0 refills | Status: DC | PRN

## 2018-03-29 ENCOUNTER — Other Ambulatory Visit: Payer: Self-pay | Admitting: Family Medicine

## 2018-03-29 DIAGNOSIS — G44301 Post-traumatic headache, unspecified, intractable: Secondary | ICD-10-CM

## 2018-03-29 MED ORDER — TIZANIDINE HCL 4 MG PO TABS: ORAL_TABLET | ORAL | 0 refills | Status: DC

## 2018-04-12 ENCOUNTER — Other Ambulatory Visit: Payer: Self-pay | Admitting: Family Medicine

## 2018-04-12 ENCOUNTER — Encounter: Payer: Self-pay | Admitting: Family Medicine

## 2018-04-12 DIAGNOSIS — G894 Chronic pain syndrome: Secondary | ICD-10-CM

## 2018-04-12 MED ORDER — OXYCODONE HCL 10 MG PO TABS: 5.0000 mg | ORAL_TABLET | Freq: Three times a day (TID) | ORAL | 0 refills | Status: DC | PRN

## 2018-04-23 ENCOUNTER — Other Ambulatory Visit: Payer: Self-pay | Admitting: Family Medicine

## 2018-04-23 MED ORDER — DICYCLOMINE HCL 10 MG PO CAPS: ORAL_CAPSULE | ORAL | 1 refills | Status: DC

## 2018-04-28 ENCOUNTER — Telehealth: Payer: Self-pay | Admitting: Family Medicine

## 2018-04-28 NOTE — Telephone Encounter (Signed)
Called the patient informed we are currrently doing Virtual visits.  The patient stated she is currently (as well as entire family) doing well and no problems. Medications doing well. Will certainly call to schedule Virtual Visit if anything does come up.

## 2018-05-04 ENCOUNTER — Other Ambulatory Visit: Payer: Self-pay | Admitting: Family Medicine

## 2018-05-04 DIAGNOSIS — M2550 Pain in unspecified joint: Secondary | ICD-10-CM

## 2018-05-04 DIAGNOSIS — G44301 Post-traumatic headache, unspecified, intractable: Secondary | ICD-10-CM

## 2018-05-04 MED ORDER — OMEPRAZOLE 40 MG PO CPDR: 40.0000 mg | DELAYED_RELEASE_CAPSULE | Freq: Every day | ORAL | 0 refills | Status: DC

## 2018-05-04 MED ORDER — TIZANIDINE HCL 4 MG PO TABS: ORAL_TABLET | ORAL | 0 refills | Status: DC

## 2018-05-04 MED ORDER — MELOXICAM 15 MG PO TABS: ORAL_TABLET | ORAL | 0 refills | Status: DC

## 2018-05-05 ENCOUNTER — Encounter: Payer: Self-pay | Admitting: Family Medicine

## 2018-05-05 ENCOUNTER — Ambulatory Visit (INDEPENDENT_AMBULATORY_CARE_PROVIDER_SITE_OTHER): Payer: BLUE CROSS/BLUE SHIELD | Admitting: Family Medicine

## 2018-05-05 ENCOUNTER — Other Ambulatory Visit: Payer: Self-pay

## 2018-05-05 DIAGNOSIS — G894 Chronic pain syndrome: Secondary | ICD-10-CM

## 2018-05-05 DIAGNOSIS — G44301 Post-traumatic headache, unspecified, intractable: Secondary | ICD-10-CM

## 2018-05-05 DIAGNOSIS — F4310 Post-traumatic stress disorder, unspecified: Secondary | ICD-10-CM

## 2018-05-05 MED ORDER — DULOXETINE HCL 30 MG PO CPEP: 30.0000 mg | ORAL_CAPSULE | Freq: Every day | ORAL | 3 refills | Status: DC

## 2018-05-05 MED ORDER — TIZANIDINE HCL 4 MG PO TABS: ORAL_TABLET | ORAL | 2 refills | Status: DC

## 2018-05-05 MED ORDER — OXYCODONE HCL 10 MG PO TABS: 5.0000 mg | ORAL_TABLET | Freq: Three times a day (TID) | ORAL | 0 refills | Status: DC | PRN

## 2018-05-05 NOTE — Progress Notes (Signed)
Chief Complaint  Patient presents with  . Medication Refill    Subjective: Patient is a 43 y.o. female here for f/u. Due to COVID-19 pandemic, we are interacting via web portal for an electronic face-to-face visit. I verified patient's ID using 2 identifiers. Patient agreed to proceed with visit via this method. Patient is at home, I am at office.   She has hx of chronic pain syndrome and FM. Currently on oxycodone 5-10 mg tid prn that was started by pain management. I took over when she had financial hardship. Tolerating well. Also on nortriptyline, does not notice a huge benefit from this. It does make her sleepy.  +hx of PTSD. Did OK on Lexapro, did have dry mouth with it so was changed. Would like to see psych, having trouble getting in. Also would like to see a Veterinary surgeon.   ROS: MSK: +pain all over Psych: +anxiety   Past Medical History:  Diagnosis Date  . AC (acromioclavicular) joint bone spurs, unspecified laterality    in neck  . Allergy   . Depression   . Eating disorder   . Fibromyalgia   . Hay fever   . History of chicken pox   . History of fainting spells of unknown cause   . Migraine   . Osteoarthritis   . Osteopenia   . Prediabetes   . PTSD (post-traumatic stress disorder)   . Sacroiliitis (HCC)   . Scheurmann's disease     Objective: No conversational dyspnea Age appropriate judgment and insight Nml affect and mood  Assessment and Plan: Chronic pain syndrome - Plan: Oxycodone HCl 10 MG TABS, DULoxetine (CYMBALTA) 30 MG capsule  PTSD (post-traumatic stress disorder) - Plan: DULoxetine (CYMBALTA) 30 MG capsule  Orders as above. Psych resources and counseling resources sent via MyChart. F/u in 6 weeks to reck.  The patient voiced understanding and agreement to the plan.  Jilda Roche [place], DO 05/05/18  10:11 AM

## 2018-06-01 ENCOUNTER — Encounter: Payer: Self-pay | Admitting: Family Medicine

## 2018-06-01 ENCOUNTER — Other Ambulatory Visit: Payer: Self-pay | Admitting: Family Medicine

## 2018-06-01 DIAGNOSIS — G894 Chronic pain syndrome: Secondary | ICD-10-CM

## 2018-06-01 MED ORDER — OXYCODONE HCL 10 MG PO TABS: 5.0000 mg | ORAL_TABLET | Freq: Three times a day (TID) | ORAL | 0 refills | Status: DC | PRN

## 2018-06-07 ENCOUNTER — Other Ambulatory Visit: Payer: Self-pay | Admitting: Family Medicine

## 2018-06-07 DIAGNOSIS — M2550 Pain in unspecified joint: Secondary | ICD-10-CM

## 2018-06-07 MED ORDER — OMEPRAZOLE 40 MG PO CPDR: 40.0000 mg | DELAYED_RELEASE_CAPSULE | Freq: Every day | ORAL | 0 refills | Status: DC

## 2018-06-07 MED ORDER — MELOXICAM 15 MG PO TABS: ORAL_TABLET | ORAL | 0 refills | Status: DC

## 2018-07-02 ENCOUNTER — Encounter: Payer: Self-pay | Admitting: Family Medicine

## 2018-07-02 DIAGNOSIS — G894 Chronic pain syndrome: Secondary | ICD-10-CM

## 2018-07-02 MED ORDER — OXYCODONE HCL 10 MG PO TABS: 5.0000 mg | ORAL_TABLET | Freq: Three times a day (TID) | ORAL | 0 refills | Status: DC | PRN

## 2018-07-12 ENCOUNTER — Other Ambulatory Visit: Payer: Self-pay | Admitting: Family Medicine

## 2018-07-12 DIAGNOSIS — M2550 Pain in unspecified joint: Secondary | ICD-10-CM

## 2018-07-12 MED ORDER — MELOXICAM 15 MG PO TABS: ORAL_TABLET | ORAL | 3 refills | Status: DC

## 2018-07-12 MED ORDER — OMEPRAZOLE 40 MG PO CPDR: 40.0000 mg | DELAYED_RELEASE_CAPSULE | Freq: Every day | ORAL | 3 refills | Status: DC

## 2018-07-27 ENCOUNTER — Encounter: Payer: Self-pay | Admitting: Family Medicine

## 2018-07-27 DIAGNOSIS — G894 Chronic pain syndrome: Secondary | ICD-10-CM

## 2018-07-27 MED ORDER — OXYCODONE HCL 10 MG PO TABS: 5.0000 mg | ORAL_TABLET | Freq: Three times a day (TID) | ORAL | 0 refills | Status: DC | PRN

## 2018-07-28 MED ORDER — OXYCODONE HCL 10 MG PO TABS: 5.0000 mg | ORAL_TABLET | Freq: Four times a day (QID) | ORAL | 0 refills | Status: DC | PRN

## 2018-07-28 NOTE — Addendum Note (Signed)
Addended by: Ames Coupe on: 07/28/2018 09:48 AM   Modules accepted: Orders

## 2018-08-24 ENCOUNTER — Encounter: Payer: Self-pay | Admitting: Family Medicine

## 2018-08-30 ENCOUNTER — Telehealth: Payer: Self-pay | Admitting: *Deleted

## 2018-08-30 MED ORDER — AIMOVIG 70 MG/ML ~~LOC~~ SOAJ: 70.0000 mg | SUBCUTANEOUS | 3 refills | Status: DC

## 2018-08-30 NOTE — Telephone Encounter (Addendum)
Received my chart form patient stating [facility] told her there are no refills remaining. Will send in new Rx to [facility] and called [facility], LVM advising to d/c previous Rx sent in Feb.

## 2018-08-30 NOTE — Addendum Note (Signed)
Addended by: Minna Antis on: 08/30/2018 02:41 PM   Modules accepted: Orders

## 2018-08-30 NOTE — Telephone Encounter (Signed)
Received fax from [facility], re: Aimovig needs PA. Started PA on CMM, key: [id number]. Dx;  43.109, Failed: topiramate, gabapentin, sumatriptan, amitriptyline, nortriptyline, depakote, Midrin, fioricet.  Informaiton sent to [facility].  Patient had sent my chart asking about new Rx, original Rx was sent to [facility]. She also needed FU scheduled.   Received approval for Aimovig through 03/02/2019. Approval letter faxed to [facility]. Called patient and advised her of approval and that it was faxed to [facility]. She asked about refill, and I advised her Dr Leta Baptist refilled x 1 year in Feb. She will call [facility] for next refill. She stated she is doing well, agreed to FU with Dr Leta Baptist in Nov. We scheduled that apt. She then stated her mother was referred here for dementia; she wants to make appt. I advised her I'd have Nikki, new pt referrals call her. She verbalized understanding, appreciation. Nikki notified, stated the referral was in Dec, will let daughter know PCP needs to send new referral for her mom.

## 2018-09-21 ENCOUNTER — Other Ambulatory Visit: Payer: Self-pay | Admitting: Family Medicine

## 2018-09-21 DIAGNOSIS — G894 Chronic pain syndrome: Secondary | ICD-10-CM

## 2018-09-21 DIAGNOSIS — F4310 Post-traumatic stress disorder, unspecified: Secondary | ICD-10-CM

## 2018-09-21 MED ORDER — DULOXETINE HCL 30 MG PO CPEP: 30.0000 mg | ORAL_CAPSULE | Freq: Every day | ORAL | 3 refills | Status: DC

## 2018-10-19 ENCOUNTER — Encounter: Payer: Self-pay | Admitting: Family Medicine

## 2018-10-19 DIAGNOSIS — G894 Chronic pain syndrome: Secondary | ICD-10-CM

## 2018-10-19 MED ORDER — OXYCODONE HCL 10 MG PO TABS: 5.0000 mg | ORAL_TABLET | Freq: Four times a day (QID) | ORAL | 0 refills | Status: DC | PRN

## 2018-11-12 ENCOUNTER — Other Ambulatory Visit: Payer: Self-pay | Admitting: Family Medicine

## 2018-11-12 ENCOUNTER — Encounter: Payer: Self-pay | Admitting: Family Medicine

## 2018-11-12 MED ORDER — DULOXETINE HCL 60 MG PO CPEP: 60.0000 mg | ORAL_CAPSULE | Freq: Every day | ORAL | 3 refills | Status: DC

## 2018-11-22 ENCOUNTER — Ambulatory Visit: Payer: Self-pay | Admitting: Diagnostic Neuroimaging

## 2018-11-22 ENCOUNTER — Telehealth: Payer: Self-pay | Admitting: *Deleted

## 2018-11-22 NOTE — Telephone Encounter (Signed)
Patient was no show for follow up today. 

## 2018-11-23 ENCOUNTER — Encounter: Payer: Self-pay | Admitting: Diagnostic Neuroimaging

## 2018-11-29 ENCOUNTER — Encounter: Payer: Self-pay | Admitting: Family Medicine

## 2018-11-29 ENCOUNTER — Ambulatory Visit (INDEPENDENT_AMBULATORY_CARE_PROVIDER_SITE_OTHER): Payer: BC Managed Care – PPO | Admitting: Family Medicine

## 2018-11-29 ENCOUNTER — Other Ambulatory Visit: Payer: Self-pay

## 2018-11-29 VITALS — BP 102/68 | HR 70 | Temp 97.3°F | Ht 72.0 in | Wt 174.0 lb

## 2018-11-29 DIAGNOSIS — M461 Sacroiliitis, not elsewhere classified: Secondary | ICD-10-CM

## 2018-11-29 DIAGNOSIS — M2550 Pain in unspecified joint: Secondary | ICD-10-CM | POA: Diagnosis not present

## 2018-11-29 DIAGNOSIS — Z1231 Encounter for screening mammogram for malignant neoplasm of breast: Secondary | ICD-10-CM

## 2018-11-29 DIAGNOSIS — M25511 Pain in right shoulder: Secondary | ICD-10-CM | POA: Diagnosis not present

## 2018-11-29 DIAGNOSIS — Z0000 Encounter for general adult medical examination without abnormal findings: Secondary | ICD-10-CM

## 2018-11-29 DIAGNOSIS — Z23 Encounter for immunization: Secondary | ICD-10-CM | POA: Diagnosis not present

## 2018-11-29 DIAGNOSIS — Z79899 Other long term (current) drug therapy: Secondary | ICD-10-CM

## 2018-11-29 MED ORDER — METHYLPREDNISOLONE ACETATE 40 MG/ML IJ SUSP
40.0000 mg | Freq: Once | INTRAMUSCULAR | Status: AC
  Administered 2018-11-29: 40 mg via INTRA_ARTICULAR

## 2018-11-29 MED ORDER — DULOXETINE HCL 60 MG PO CPEP: 60.0000 mg | ORAL_CAPSULE | Freq: Every day | ORAL | 7 refills | Status: DC

## 2018-11-29 MED ORDER — MELOXICAM 15 MG PO TABS: ORAL_TABLET | ORAL | 10 refills | Status: DC

## 2018-11-29 MED ORDER — DICYCLOMINE HCL 10 MG PO CAPS: ORAL_CAPSULE | ORAL | 5 refills | Status: DC

## 2018-11-29 NOTE — Patient Instructions (Signed)
Give us 2-3 business days to get the results of your labs back.   Keep the diet clean and stay active.  Someone will reach out to you regarding your mammogram  Let us know if you need anything.  

## 2018-11-29 NOTE — Progress Notes (Signed)
Chief Complaint  Patient presents with  . Annual Exam  . Shoulder Pain  . Back Pain     Well Woman Gail Watkins is here for a complete physical.   Her last physical was >1 year ago.  Current diet: in general, a "healthy" diet. Current exercise: walking. Weight is decreasing intentionally and she denies daytime fatigue. Seatbelt? Yes  Patient has a history of chronic SI joint pain in addition to right shoulder pain.  It has been hurting worse over the past several weeks.  No injury or change in activity.  The right shoulder has overall normal range of motion.  It will sometimes pop but range of motion is smooth without catching/locking.  There is no bruising, redness, or swelling over either joint.  Steroid injections have helped in the past with the SI joint.  She has not had a recent 1 for the right shoulder but it did help in the past.  Health Maintenance Pap/HPV- Yes Mammogram- No Tetanus- Yes HIV screening- Yes  Past Medical History:  Diagnosis Date  . AC (acromioclavicular) joint bone spurs, unspecified laterality    in neck  . Allergy   . Depression   . Eating disorder   . Fibromyalgia   . Hay fever   . History of chicken pox   . History of fainting spells of unknown cause   . Migraine   . Osteoarthritis   . Osteopenia   . Prediabetes   . PTSD (post-traumatic stress disorder)   . Sacroiliitis ([place])   . Scheurmann's disease      Past Surgical History:  Procedure Laterality Date  . ACHILLES TENDON SURGERY Right   . CESAREAN SECTION     x 2  . DILATION AND CURETTAGE OF UTERUS    . OVARIAN CYST REMOVAL Bilateral   . WISDOM TOOTH EXTRACTION      Medications  Current Outpatient Medications on File Prior to Visit  Medication Sig Dispense Refill  . calcium-vitamin D (OSCAL WITH D) 500-200 MG-UNIT tablet Take 1 tablet by mouth.    . dicyclomine (BENTYL) 10 MG capsule TAKE 1 CAPSULE(10 MG) BY MOUTH FOUR TIMES DAILY BEFORE MEALS AND AT BEDTIME 120 capsule 1  .  DULoxetine (CYMBALTA) 60 MG capsule Take 1 capsule (60 mg total) by mouth daily. 30 capsule 3  . Erenumab-aooe (AIMOVIG) 70 MG/ML SOAJ Inject 70 mg into the skin every 30 (thirty) days. 3 pen 3  . gabapentin (NEURONTIN) 300 MG capsule Take 1 capsule (300 mg total) by mouth 3 (three) times daily. 90 capsule 3  . Lidocaine 4 % AERO Apply 1-2 sprays topically 4 (four) times daily as needed (Pain). 1 Can 1  . Melatonin 5 MG TABS Take 10 tablets by mouth at bedtime.    . meloxicam (MOBIC) 15 MG tablet TAKE 1 TABLET(15 MG) BY MOUTH DAILY 30 tablet 3  . omeprazole (PRILOSEC) 40 MG capsule Take 1 capsule (40 mg total) by mouth daily. 30 capsule 3  . Oxycodone HCl 10 MG TABS Take 0.5-1 tablets (5-10 mg total) by mouth every 6 (six) hours as needed (Pain). 90 tablet 0  . [START ON 12/18/2018] Oxycodone HCl 10 MG TABS Take 0.5-1 tablets (5-10 mg total) by mouth every 6 (six) hours as needed (Pain). 90 tablet 0  . Oxycodone HCl 10 MG TABS Take 0.5-1 tablets (5-10 mg total) by mouth every 6 (six) hours as needed (Pain). 90 tablet 0  . rizatriptan (MAXALT-MLT) 10 MG disintegrating tablet Take 1 tablet (  10 mg total) by mouth as needed for migraine. May repeat in 2 hours if needed 9 tablet 11  . tiZANidine (ZANAFLEX) 4 MG tablet Take 1-2 tabs every 8 hours. 180 tablet 2  . topiramate (TOPAMAX) 100 MG tablet Take 1 tablet (100 mg total) by mouth 2 (two) times daily. 180 tablet 4    Allergies Allergies  Allergen Reactions  . Sulfa Antibiotics Anaphylaxis    PO only, does fine w topical  . Vancomycin Anaphylaxis  . Reglan [Metoclopramide]     "it makes me feel like im crawling out of my skin"   . Terbutaline     "it makes me feel like im crawling out of my skin"     Review of Systems: Constitutional:  no unexpected weight changes Eye:  no recent significant change in vision Ear/Nose/Mouth/Throat:  Ears:  no recent change in hearing Nose/Mouth/Throat:  no complaints of nasal congestion, no sore  throat Cardiovascular: no chest pain Respiratory:  no shortness of breath Gastrointestinal:  no abdominal pain, no change in bowel habits GU:  Female: negative for dysuria or pelvic pain Musculoskeletal/Extremities: +R shoulder and L SI jt pain Integumentary (Skin/Breast):  no abnormal skin lesions reported Neurologic:  no headaches Endocrine:  denies fatigue Hematologic/Lymphatic:  No areas of easy bleeding  Exam BP 102/68 (BP Location: Left Arm, Patient Position: Sitting, Cuff Size: Normal)   Pulse 70   Temp (!) 97.3 F (36.3 C) (Temporal)   Ht 6' (1.829 m)   Wt 174 lb (78.9 kg)   SpO2 96%   BMI 23.60 kg/m  General:  well developed, well nourished, in no apparent distress Skin:  no significant moles, warts, or growths Head:  no masses, lesions, or tenderness Eyes:  pupils equal and round, sclera anicteric without injection Ears:  canals without lesions, TMs shiny without retraction, no obvious effusion, no erythema Nose:  nares patent, septum midline, mucosa normal, and no drainage or sinus tenderness Throat/Pharynx:  lips and gingiva without lesion; tongue and uvula midline; non-inflamed pharynx; no exudates or postnasal drainage Neck: neck supple without adenopathy, thyromegaly, or masses Lungs:  clear to auscultation, breath sounds equal bilaterally, no respiratory distress Cardio:  regular rate and rhythm, no LE edema Abdomen:  abdomen soft, nontender; bowel sounds normal; no masses or organomegaly Genital: Defer to GYN Musculoskeletal: R shoulder- decreased abduction, +Hawkins and Empty Can, equivocal Neer's, neg Speed's, cross over, lift off +TTP over L SI jt Extremities:  no clubbing, cyanosis, or edema, no deformities, no skin discoloration Neuro:  deep tendon reflexes normal and symmetric Psych: well oriented with normal range of affect and appropriate judgment/insight  Procedure note; SI joint injection Informed consent obtained. The PSIS's were palpated and  demarcated with an otoscope speculum tip just medial to the side of interest. The area was cleaned with alcohol. The joint was entered and 40 mg Depomedrol with 2 mL of 1% lidocaine was injected. The area was then bandaged. There were no complications noted.  The patient tolerated the procedure well.  Procedure Note; Shoulder bursa injection Verbal consent obtained. The area was palpated, an area was marked just caudal to the acromion process laterally, and cleaned with alcohol x1. A 27-gauge needle was used to enter the joint laterally with ease. 40 mg of Depomedrol with 2 mL of 1% lidocaine was injected. The patient tolerated the procedure well. There were no complications noted.  Assessment and Plan  Well adult exam - Plan: CBC, Comp Met (CMET), Lipid Profile  Need  for influenza vaccination - Plan: Flu Vaccine QUAD 6+ mos PF IM (Fluarix Quad PF)  Arthralgia, unspecified joint - Plan: meloxicam (MOBIC) 15 MG tablet  Encounter for screening mammogram for malignant neoplasm of breast - Plan: MM Digital Screening  High risk medications (not anticoagulants) long-term use - Plan: Pain Mgmt, Profile 8 w/Conf, U  Sacroiliitis (HCC) - Plan: methylPREDNISolone acetate (DEPO-MEDROL) injection 40 mg, PR DRAIN/INJECT LARGE JOINT/BURSA  Right shoulder pain, unspecified chronicity - Plan: methylPREDNISolone acetate (DEPO-MEDROL) injection 40 mg, PR DRAIN/INJECT LARGE JOINT/BURSA   Well 43 y.o. female. Counseled on diet and exercise. Shared decision making performed regarding mammograms for breast cancer screening and limitations in younger females. Other orders as above. Follow up in 6 mo. The patient voiced understanding and agreement to the plan.  [place], DO 11/29/18 5:11 PM

## 2018-11-30 NOTE — Addendum Note (Signed)
Addended by: Sharon Seller B on: 11/30/2018 07:35 AM   Modules accepted: Orders

## 2018-12-06 ENCOUNTER — Other Ambulatory Visit: Payer: Self-pay | Admitting: Family Medicine

## 2018-12-06 DIAGNOSIS — G44301 Post-traumatic headache, unspecified, intractable: Secondary | ICD-10-CM

## 2018-12-06 MED ORDER — TIZANIDINE HCL 4 MG PO TABS: ORAL_TABLET | ORAL | 2 refills | Status: AC

## 2018-12-07 ENCOUNTER — Other Ambulatory Visit: Payer: Self-pay | Admitting: Family Medicine

## 2018-12-07 MED ORDER — OMEPRAZOLE 40 MG PO CPDR: 40.0000 mg | DELAYED_RELEASE_CAPSULE | Freq: Every day | ORAL | 3 refills | Status: DC

## 2018-12-13 ENCOUNTER — Ambulatory Visit (HOSPITAL_BASED_OUTPATIENT_CLINIC_OR_DEPARTMENT_OTHER): Payer: BC Managed Care – PPO

## 2018-12-31 IMAGING — MR MR LUMBAR SPINE WO/W CM
7 of 8 series · 30 of 48 positions shown · IV contrast (multihance)
Comparison: Abdominal radiograph 07/06/2016

CLINICAL DATA: Severe low back pain radiating into both lower
extremities.

EXAM:
MRI LUMBAR SPINE WITHOUT AND WITH CONTRAST
TECHNIQUE: Multiplanar and multiecho pulse sequences of the lumbar spine were
obtained without and with intravenous contrast.
CONTRAST:  15mL MULTIHANCE GADOBENATE DIMEGLUMINE 529 MG/ML IV SOLN

[Series 2: STIR · sagittal · 4.0mm · 1.02mm/px · 4 of 13 slices shown]
[im 1/13]
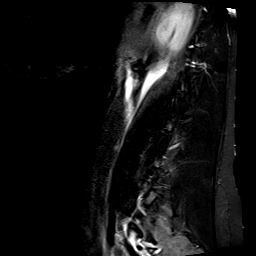
[im 5/13]
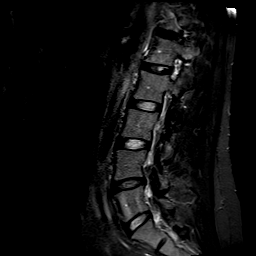
[im 9/13]
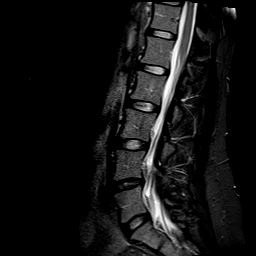
[im 13/13]
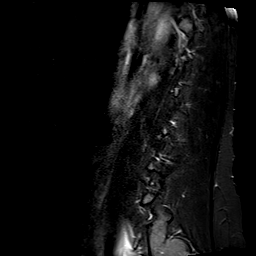

[Series 3: T1 · sagittal · 4.0mm · 0.51mm/px · 3 of 13 slices shown (1 of 2)]
[im 1/13]
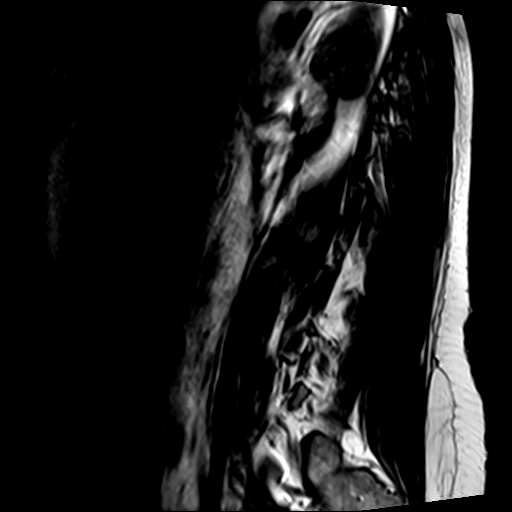
[im 7/13]
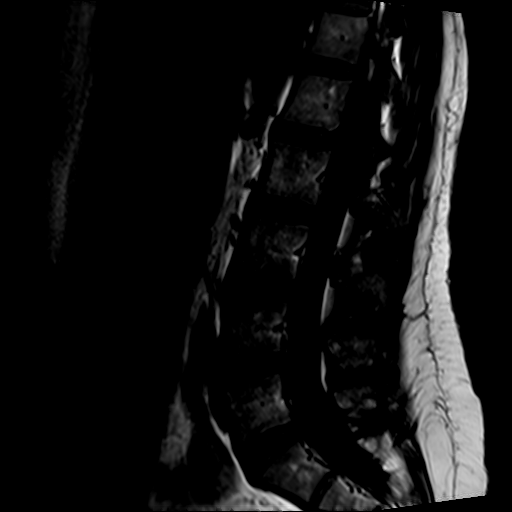
[im 13/13]
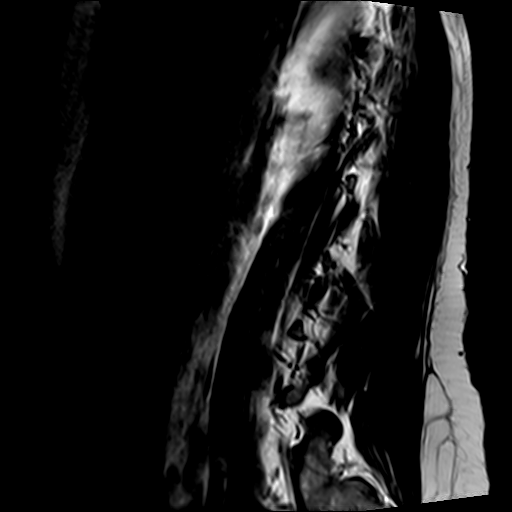

[Series 4: T2 · axial · 4.0mm · 0.39mm/px · z∈[-128,+92]mm · 8 of 40 slices shown (1 of 2)]
[im 1/40]
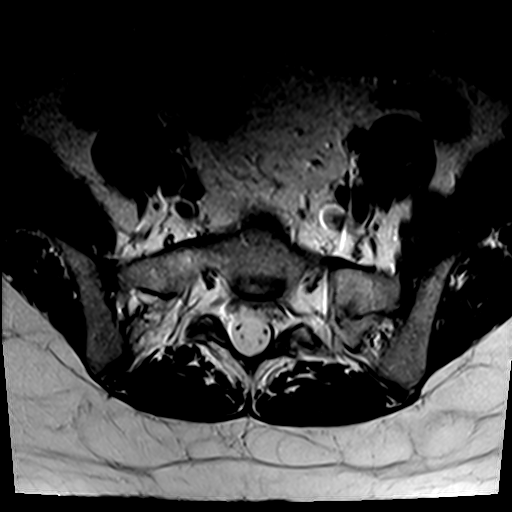
[im 5/40]
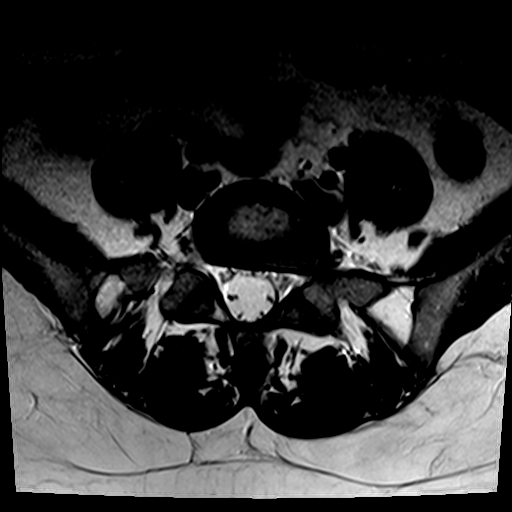
[im 14/40]
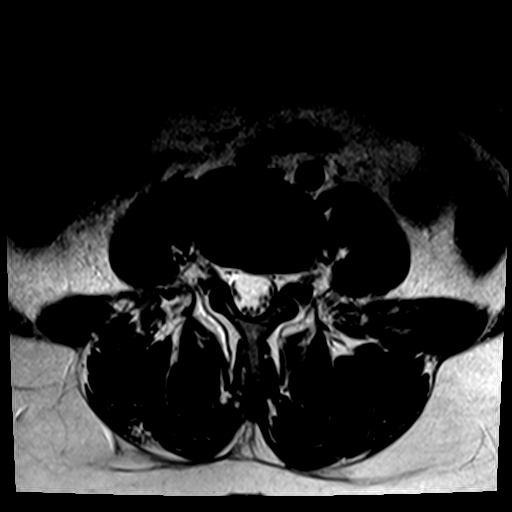
[im 18/40]
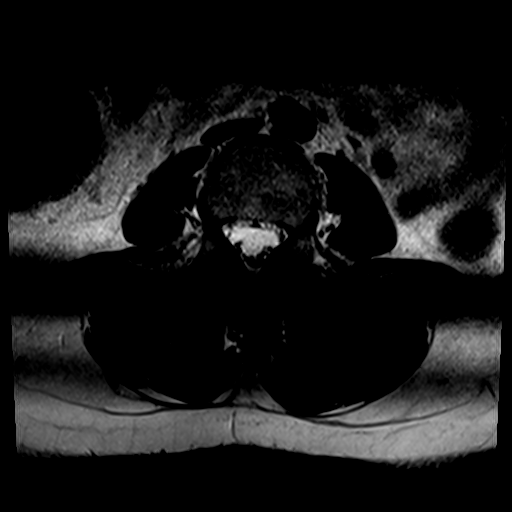
[im 22/40]
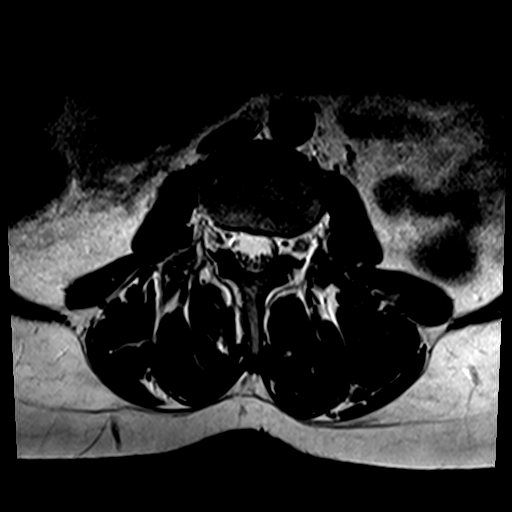
[im 27/40]
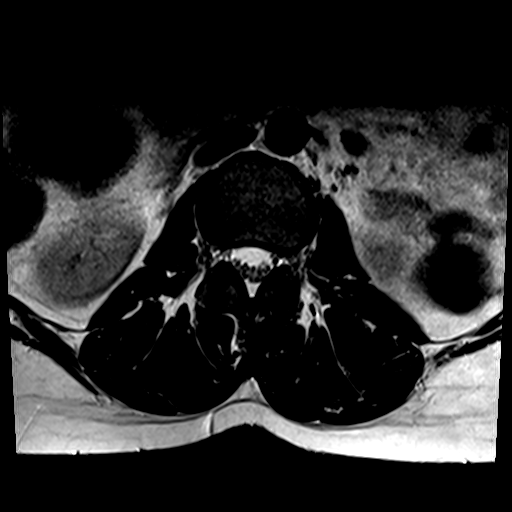
[im 35/40]
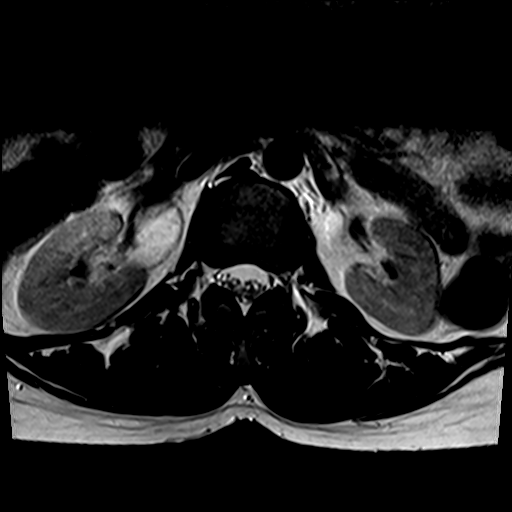
[im 40/40]
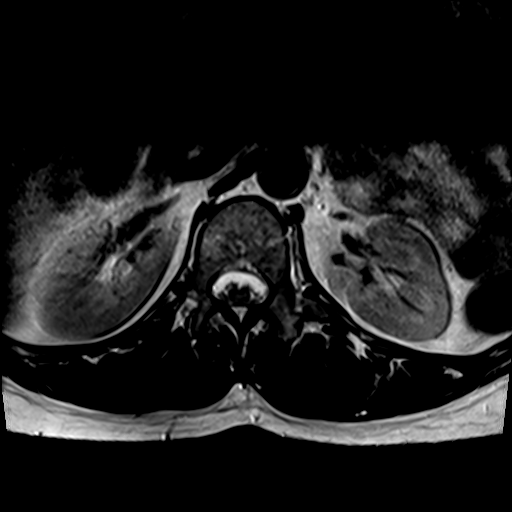

[Series 5: T1 · axial · 4.0mm · 0.78mm/px · z∈[-128,+92]mm · 8 of 40 slices shown (2 of 2)]
[im 1/40]
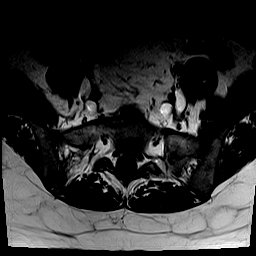
[im 5/40]
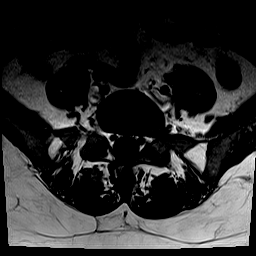
[im 14/40]
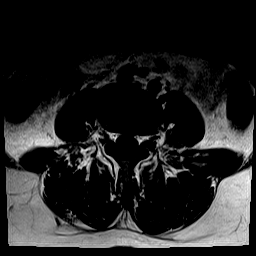
[im 18/40]
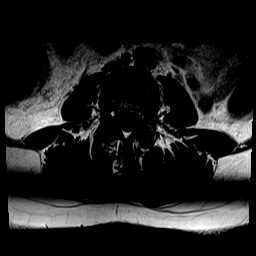
[im 22/40]
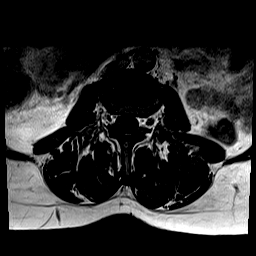
[im 27/40]
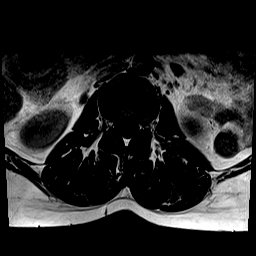
[im 35/40]
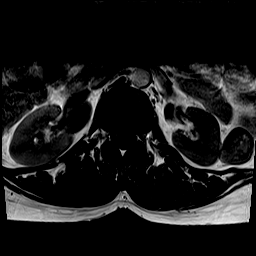
[im 40/40]
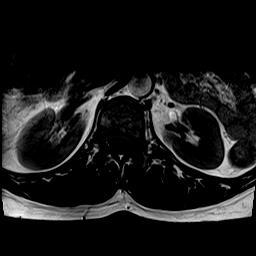

[Series 6: T2 · sagittal · 4.0mm · 0.81mm/px · 3 of 13 slices shown (2 of 2)]
[im 1/13]
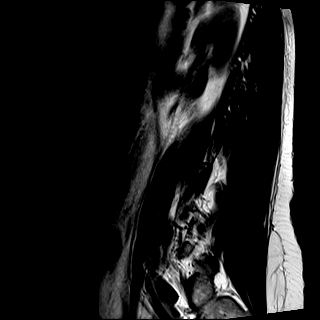
[im 7/13]
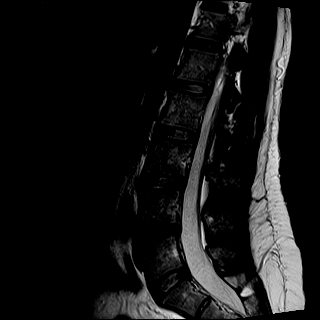
[im 13/13]
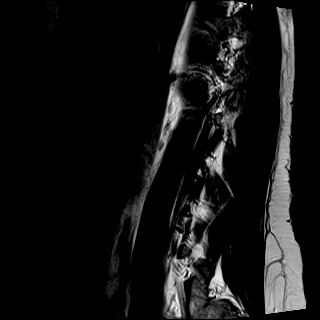

[Series 7: T1 fat-sat post-contrast · sagittal · 4.0mm · 0.51mm/px · 3 of 13 slices shown]
[im 1/13]
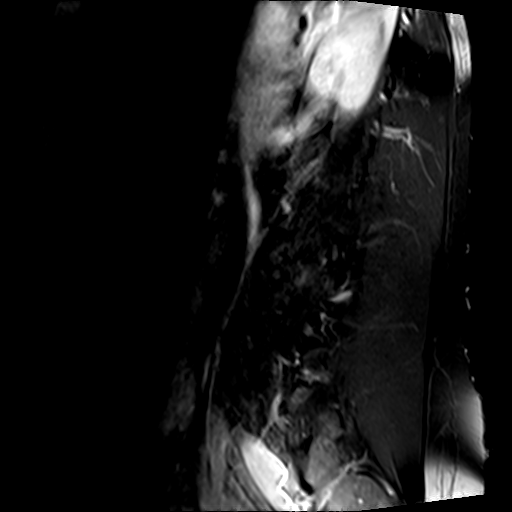
[im 7/13]
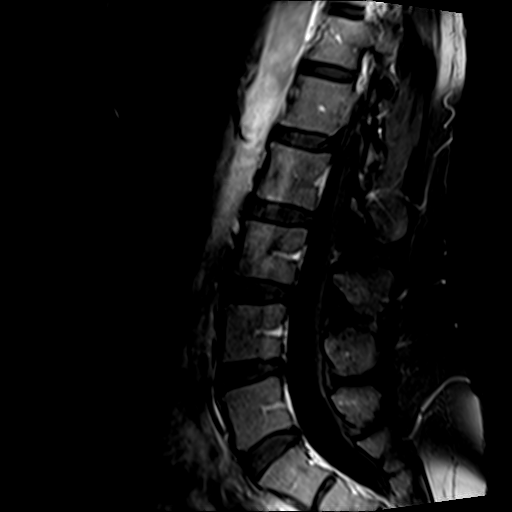
[im 13/13]
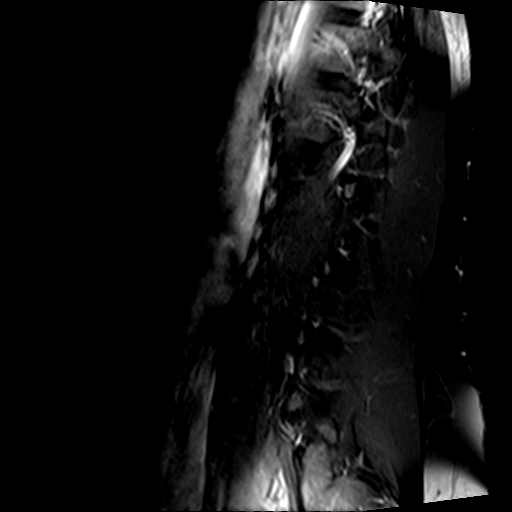

[Series 8: T1 post-contrast · axial · 4.0mm · 0.78mm/px · 1 of 40 slices shown]
[im 1/40]
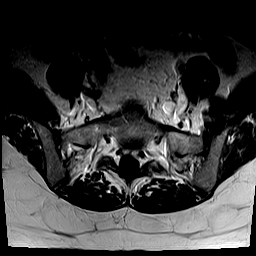

[30 of 48 positions shown; findings below may reference images not displayed]

FINDINGS: Segmentation: Normal. The lowest disc space is considered to be
L5-S1.

Alignment:  Normal

Vertebrae: No acute compression fracture, discitis-osteomyelitis,
facet edema or other focal marrow lesion. No epidural collection.

Conus medullaris: Extends to the L1 level and appears normal.

Paraspinal and other soft tissues: The visualized aorta, IVC and
iliac vessels are normal. The visualized retroperitoneal organs and
paraspinal soft tissues are normal.

Disc levels:

T12-L1: Normal disc space and facets. No spinal canal or
neuroforaminal stenosis.

L1-L2: Normal disc space and facets. No spinal canal or
neuroforaminal stenosis.

L2-L3: Normal disc space and facets. No spinal canal or
neuroforaminal stenosis.

L3-L4: Normal disc space and facets. No spinal canal or
neuroforaminal stenosis.

L4-L5: Normal disc space and facets. No spinal canal or
neuroforaminal stenosis.

L5-S1: Normal disc space and facets. No spinal canal or
neuroforaminal stenosis.
IMPRESSION: Normal MRI of the lumbar spine.

## 2019-01-10 ENCOUNTER — Telehealth: Payer: Self-pay | Admitting: Family Medicine

## 2019-01-10 DIAGNOSIS — G894 Chronic pain syndrome: Secondary | ICD-10-CM

## 2019-01-10 NOTE — Telephone Encounter (Signed)
I called the pharmacy for clarification and they said they last refilled this medication on 12/14/2018 and it was written on 10/19/2018.

## 2019-01-10 NOTE — Telephone Encounter (Signed)
Requesting:   oxycodone Contract:   Signed on 11/29/2018 UDS:  none Last Visit:   11/29/2018 Next Visit:   11/29/2018 Last Refill:   12/18/2018   #90 no refills  Please Advise  I sent pharmacy a note too early

## 2019-01-10 NOTE — Telephone Encounter (Signed)
Pharmacy is requesting a refill for Oxycodone HCl 10 MG TABS    Pharmacy:  [facility] - [place], [place] - [address] Phone:  [phone]  Fax:  [phone]

## 2019-01-10 NOTE — Telephone Encounter (Signed)
This is a bit early. I think she should go to a pain specialist again. This is already more than I usually deal with and early fills are not ideal and indicate poor pain control. I can refill if she agrees to do this. If there is a provider in particular she would like to see, we can arrange that or we can refer to [facility]. Ty.

## 2019-01-11 ENCOUNTER — Encounter: Payer: Self-pay | Admitting: Family Medicine

## 2019-01-12 MED ORDER — OXYCODONE HCL 10 MG PO TABS: 5.0000 mg | ORAL_TABLET | Freq: Four times a day (QID) | ORAL | 0 refills | Status: DC | PRN

## 2019-02-02 ENCOUNTER — Encounter: Payer: Self-pay | Admitting: *Deleted

## 2019-02-02 ENCOUNTER — Telehealth: Payer: Self-pay | Admitting: *Deleted

## 2019-02-02 NOTE — Telephone Encounter (Addendum)
Started Aimovig PA on CMM, key: [id number]. Sent patient a my chart to call and schedule a follow up with Amy, NP. She replied and stated she'll call tomorrow.  CMM Information was sent to [facility].

## 2019-02-02 NOTE — Telephone Encounter (Signed)
Per CMM: Request Reference Number: [id number]. AIMOVIG INJ 70MG /ML is approved through 02/02/2020. Your patient may now fill this prescription and it will be covered. Sent patient my chart to advise.

## 2019-02-08 ENCOUNTER — Other Ambulatory Visit (INDEPENDENT_AMBULATORY_CARE_PROVIDER_SITE_OTHER): Payer: BC Managed Care – PPO

## 2019-02-08 ENCOUNTER — Telehealth: Payer: Self-pay | Admitting: Family Medicine

## 2019-02-08 ENCOUNTER — Other Ambulatory Visit: Payer: Self-pay | Admitting: Family Medicine

## 2019-02-08 ENCOUNTER — Other Ambulatory Visit: Payer: Self-pay

## 2019-02-08 DIAGNOSIS — Z0000 Encounter for general adult medical examination without abnormal findings: Secondary | ICD-10-CM

## 2019-02-08 DIAGNOSIS — Z79899 Other long term (current) drug therapy: Secondary | ICD-10-CM

## 2019-02-08 LAB — CBC
HCT: 44.1 % (ref 36.0–46.0)
Hemoglobin: 14.4 g/dL (ref 12.0–15.0)
MCHC: 32.6 g/dL (ref 30.0–36.0)
MCV: 90.1 fl (ref 78.0–100.0)
Platelets: 214 10*3/uL (ref 150.0–400.0)
RBC: 4.9 Mil/uL (ref 3.87–5.11)
RDW: 14 % (ref 11.5–15.5)
WBC: 11.8 10*3/uL — ABNORMAL HIGH (ref 4.0–10.5)

## 2019-02-08 LAB — COMPREHENSIVE METABOLIC PANEL
ALT: 7 U/L (ref 0–35)
AST: 9 U/L (ref 0–37)
Albumin: 4.4 g/dL (ref 3.5–5.2)
Alkaline Phosphatase: 66 U/L (ref 39–117)
BUN: 16 mg/dL (ref 6–23)
CO2: 22 mEq/L (ref 19–32)
Calcium: 9.4 mg/dL (ref 8.4–10.5)
Chloride: 109 mEq/L (ref 96–112)
Creatinine, Ser: 0.92 mg/dL (ref 0.40–1.20)
GFR: 67.21 mL/min (ref >60.00)
Glucose, Bld: 78 mg/dL (ref 70–99)
Potassium: 4 mEq/L (ref 3.5–5.1)
Sodium: 139 mEq/L (ref 135–145)
Total Bilirubin: 0.7 mg/dL (ref 0.2–1.2)
Total Protein: 7.2 g/dL (ref 6.0–8.3)

## 2019-02-08 LAB — LIPID PANEL
Cholesterol: 127 mg/dL (ref 0–200)
HDL: 42.6 mg/dL (ref >39.00)
LDL Cholesterol: 75 mg/dL (ref 0–99)
NonHDL: 84.65
Total CHOL/HDL Ratio: 3
Triglycerides: 47 mg/dL (ref 0.0–149.0)
VLDL: 9.4 mg/dL (ref 0.0–40.0)

## 2019-02-08 NOTE — Telephone Encounter (Signed)
I sent 3 months at the pharmacy, is it not there?

## 2019-02-08 NOTE — Telephone Encounter (Signed)
Oxycodone HCl 10 MG TABS [[id number]]    Patient is requesting a refill of this medication

## 2019-02-08 NOTE — Telephone Encounter (Signed)
Requesting:   oxycodone Contract:   Signed on 11/29/2018 UDS:   02/08/2019 Last Visit:    11/29/2018 Next Visit:   none Last Refill:   #90 on 01/12/2019  Please Advise

## 2019-02-09 NOTE — Telephone Encounter (Signed)
The fill date was to have been filled  for today 02/09/19. The patient did ask the pharmacy to fill one day early and is why it came to [place] yesterday 02/08/19. But as of today is ok to fill and pharmacist stated  the patient has already been by to pickup.

## 2019-02-10 LAB — PAIN MGMT, PROFILE 8 W/CONF, U
6 Acetylmorphine: NEGATIVE ng/mL
Alcohol Metabolites: NEGATIVE ng/mL (ref <500)
Alphahydroxyalprazolam: NEGATIVE ng/mL
Alphahydroxymidazolam: NEGATIVE ng/mL
Alphahydroxytriazolam: NEGATIVE ng/mL
Aminoclonazepam: 241 ng/mL
Amphetamines: NEGATIVE ng/mL
Benzodiazepines: POSITIVE ng/mL
Buprenorphine, Urine: NEGATIVE ng/mL
Cocaine Metabolite: NEGATIVE ng/mL
Codeine: NEGATIVE ng/mL
Creatinine: 90.9 mg/dL
Hydrocodone: NEGATIVE ng/mL
Hydromorphone: NEGATIVE ng/mL
Hydroxyethylflurazepam: NEGATIVE ng/mL
Lorazepam: NEGATIVE ng/mL
MDMA: NEGATIVE ng/mL
Marijuana Metabolite: 168 ng/mL
Marijuana Metabolite: POSITIVE ng/mL
Morphine: NEGATIVE ng/mL
Nordiazepam: 52 ng/mL
Norhydrocodone: NEGATIVE ng/mL
Noroxycodone: 4042 ng/mL
Opiates: NEGATIVE ng/mL
Oxazepam: 293 ng/mL
Oxidant: NEGATIVE ug/mL
Oxycodone: 761 ng/mL
Oxycodone: POSITIVE ng/mL
Oxymorphone: 987 ng/mL
Temazepam: 163 ng/mL
pH: 5.9 (ref 4.5–9.0)

## 2019-02-18 ENCOUNTER — Encounter: Payer: Self-pay | Admitting: Family Medicine

## 2019-02-27 ENCOUNTER — Ambulatory Visit: Payer: BC Managed Care – PPO | Attending: Internal Medicine

## 2019-02-27 DIAGNOSIS — Z23 Encounter for immunization: Secondary | ICD-10-CM | POA: Insufficient documentation

## 2019-02-27 NOTE — Progress Notes (Signed)
   Covid-19 Vaccination Clinic  Name:  Gail Watkins    MRN: [id number] DOB: August 10, 1975  02/27/2019  [name] was observed post Covid-19 immunization for 15 minutes without incidence. She was provided with Vaccine Information Sheet and instruction to access the V-Safe system.   [name] was instructed to call 911 with any severe reactions post vaccine: Marland Kitchen Difficulty breathing  . Swelling of your face and throat  . A fast heartbeat  . A bad rash all over your body  . Dizziness and weakness    Immunizations Administered    Name Date Dose VIS Date Route   Pfizer COVID-19 Vaccine 02/27/2019  3:56 PM 0.3 mL 12/17/2018 Intramuscular   Manufacturer: [employer], [employer]   Lot: [id number]   NDC: [address]-4

## 2019-03-03 ENCOUNTER — Other Ambulatory Visit: Payer: Self-pay | Admitting: Diagnostic Neuroimaging

## 2019-03-03 DIAGNOSIS — G44301 Post-traumatic headache, unspecified, intractable: Secondary | ICD-10-CM

## 2019-03-11 ENCOUNTER — Encounter: Payer: Self-pay | Admitting: Family Medicine

## 2019-03-11 DIAGNOSIS — G44301 Post-traumatic headache, unspecified, intractable: Secondary | ICD-10-CM

## 2019-03-11 MED ORDER — TOPIRAMATE 100 MG PO TABS: 100.0000 mg | ORAL_TABLET | Freq: Two times a day (BID) | ORAL | 1 refills | Status: DC

## 2019-03-22 ENCOUNTER — Ambulatory Visit: Payer: BC Managed Care – PPO | Attending: Internal Medicine

## 2019-03-22 DIAGNOSIS — Z23 Encounter for immunization: Secondary | ICD-10-CM

## 2019-03-22 NOTE — Progress Notes (Signed)
   Covid-19 Vaccination Clinic  Name:  Gail Watkins    MRN: [id number] DOB: 11-13-1975  03/22/2019  Gail Watkins was observed post Covid-19 immunization for 15 minutes without incident. She was provided with Vaccine Information Sheet and instruction to access the V-Safe system.   Gail Watkins was instructed to call 911 with any severe reactions post vaccine: Marland Kitchen Difficulty breathing  . Swelling of face and throat  . A fast heartbeat  . A bad rash all over body  . Dizziness and weakness   Immunizations Administered    Name Date Dose VIS Date Route   Pfizer COVID-19 Vaccine 03/22/2019  3:48 PM 0.3 mL 12/17/2018 Intramuscular   Manufacturer: [employer], [employer]   Lot: [id number]   NDC: [address]-1

## 2019-04-11 ENCOUNTER — Telehealth (INDEPENDENT_AMBULATORY_CARE_PROVIDER_SITE_OTHER): Payer: BC Managed Care – PPO | Admitting: Diagnostic Neuroimaging

## 2019-04-11 ENCOUNTER — Encounter: Payer: Self-pay | Admitting: Diagnostic Neuroimaging

## 2019-04-11 DIAGNOSIS — G44301 Post-traumatic headache, unspecified, intractable: Secondary | ICD-10-CM

## 2019-04-11 IMAGING — US US ABDOMEN LIMITED
1 series · 14 of 25 positions shown · non-contrast
Comparison: CT abdomen and pelvis December 18, 2016

CLINICAL DATA: Right upper quadrant pain with nausea and vomiting

EXAM:
ULTRASOUND ABDOMEN LIMITED RIGHT UPPER QUADRANT

[Series 1: us abdomen limited · 0.22mm/px · 14 of 60 slices shown]
[im 1/60]
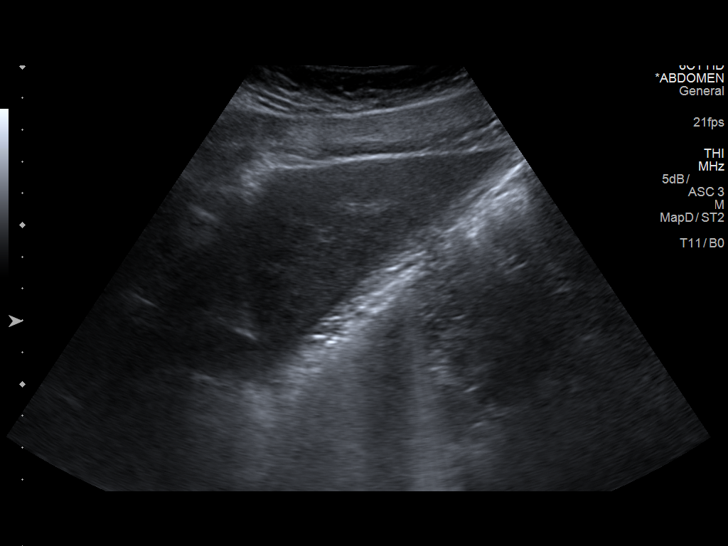
[im 5/60]
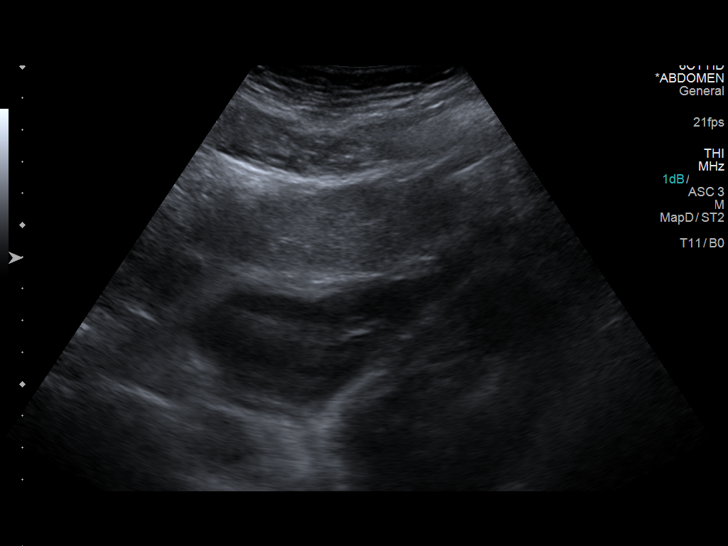
[im 10/60]
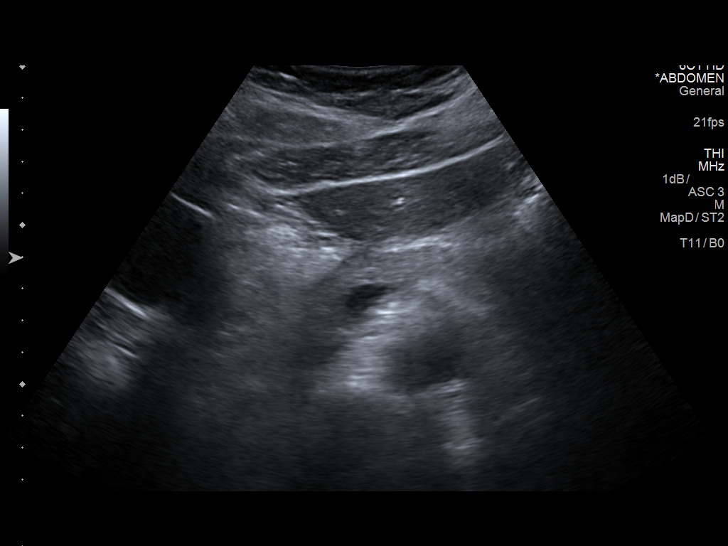
[im 15/60]
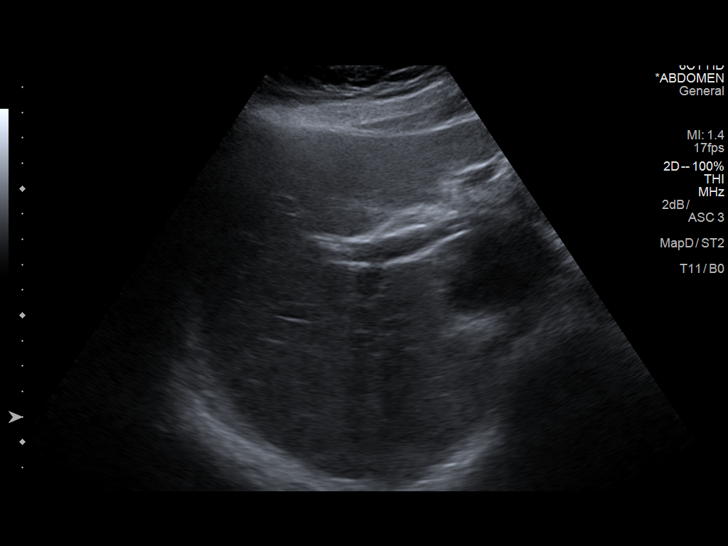
[im 20/60]
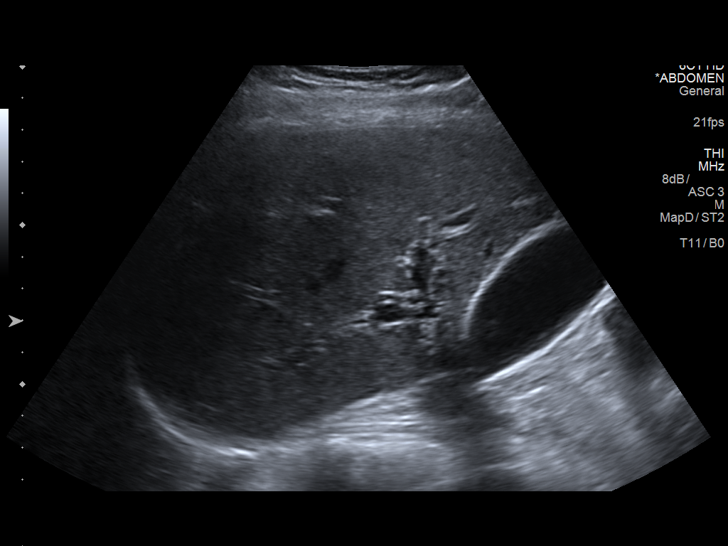
[im 23/60]
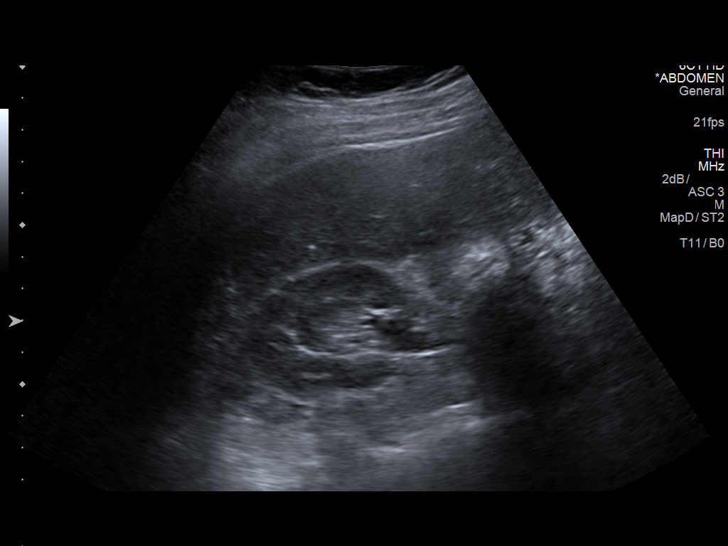
[im 28/60]
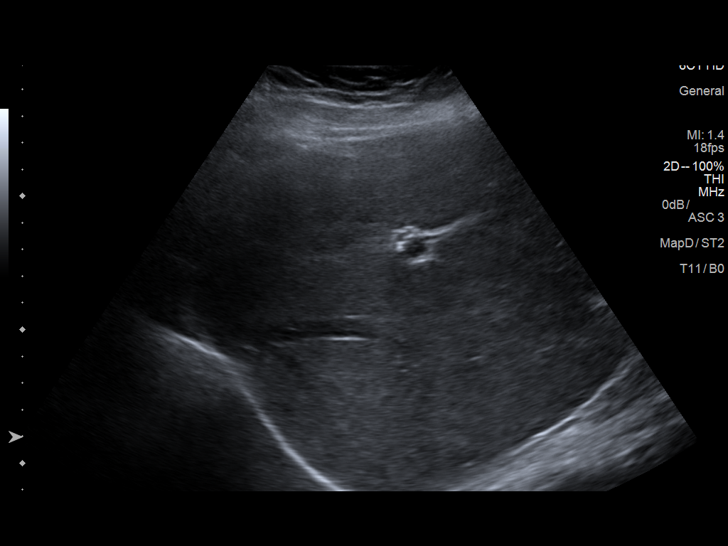
[im 32/60]
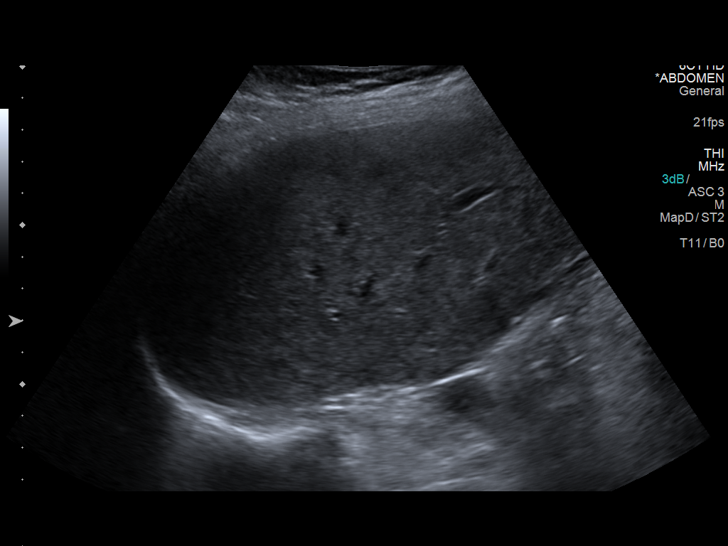
[im 37/60]
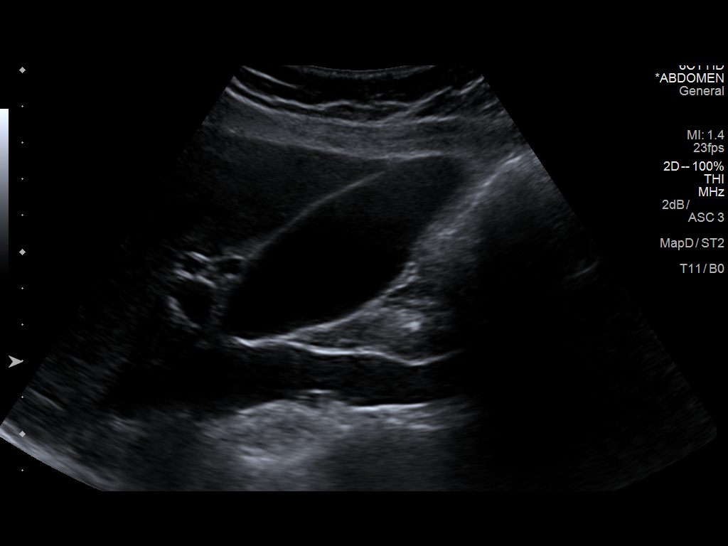
[im 40/60]
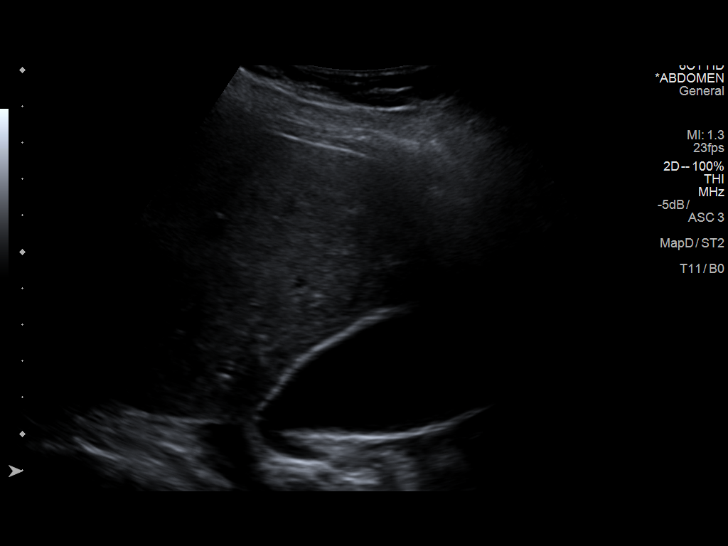
[im 45/60]
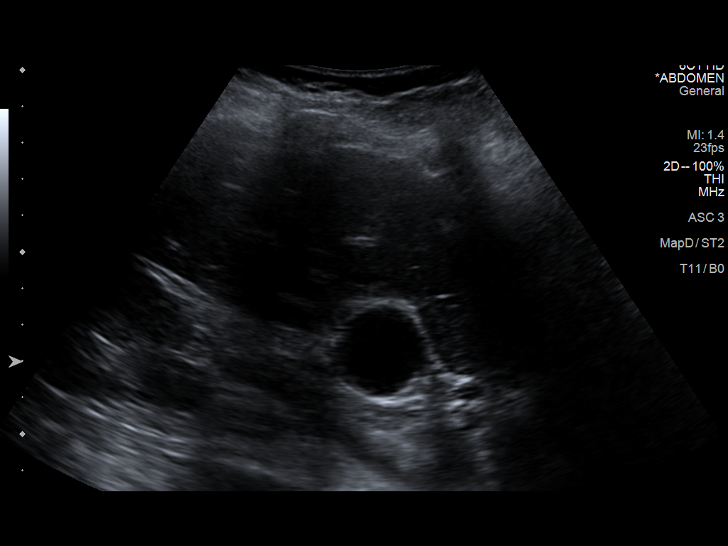
[im 50/60]
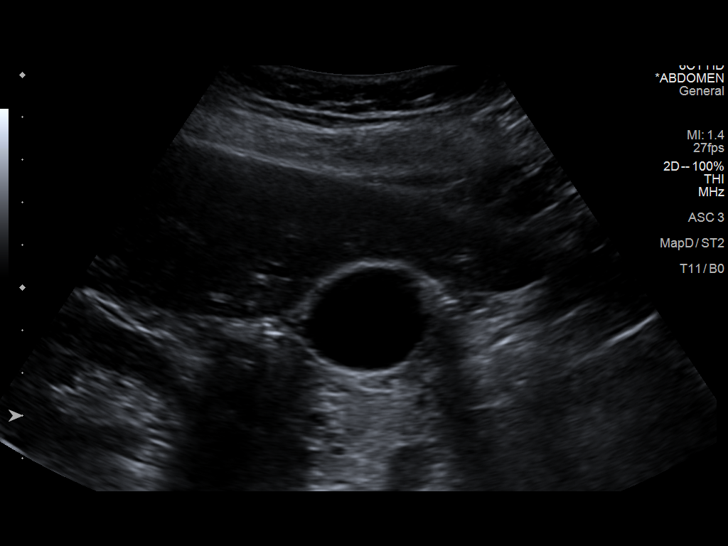
[im 55/60]
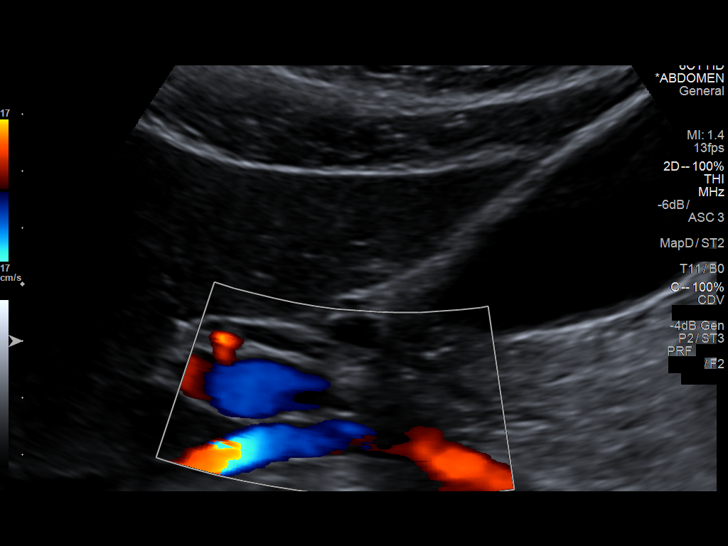
[im 60/60]
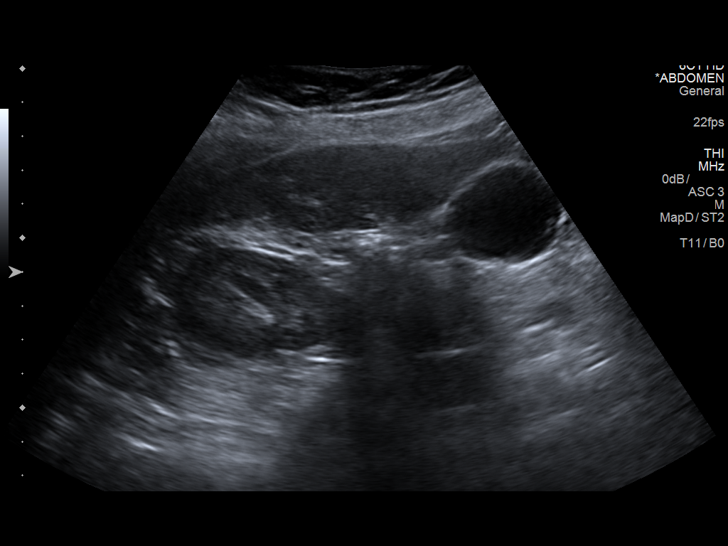

[14 of 25 positions shown; findings below may reference images not displayed]

FINDINGS: Gallbladder:

No gallstones or wall thickening visualized. There is no
pericholecystic fluid. No sonographic Murphy sign noted by
sonographer.

Common bile duct:

Diameter: 3 mm. No intrahepatic or extrahepatic biliary duct
dilatation.

Liver:

No focal lesion identified. Within normal limits in parenchymal
echogenicity. Portal vein is patent on color Doppler imaging with
normal direction of blood flow towards the liver.
IMPRESSION: Study within normal limits.

## 2019-04-11 MED ORDER — TOPIRAMATE 100 MG PO TABS: 100.0000 mg | ORAL_TABLET | Freq: Two times a day (BID) | ORAL | 1 refills | Status: DC

## 2019-04-11 MED ORDER — AIMOVIG 70 MG/ML ~~LOC~~ SOAJ: 70.0000 mg | SUBCUTANEOUS | 4 refills | Status: DC

## 2019-04-11 MED ORDER — RIZATRIPTAN BENZOATE 10 MG PO TBDP: 10.0000 mg | ORAL_TABLET | ORAL | 11 refills | Status: DC | PRN
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
## 2019-04-18 ENCOUNTER — Other Ambulatory Visit: Payer: Self-pay | Admitting: *Deleted

## 2019-04-18 MED ORDER — OMEPRAZOLE 40 MG PO CPDR: 40.0000 mg | DELAYED_RELEASE_CAPSULE | Freq: Every day | ORAL | 1 refills | Status: DC

## 2019-05-05 DIAGNOSIS — F4310 Post-traumatic stress disorder, unspecified: Secondary | ICD-10-CM | POA: Diagnosis not present

## 2019-05-05 DIAGNOSIS — Z79899 Other long term (current) drug therapy: Secondary | ICD-10-CM | POA: Diagnosis not present

## 2019-05-05 DIAGNOSIS — M25511 Pain in right shoulder: Secondary | ICD-10-CM | POA: Diagnosis not present

## 2019-05-05 DIAGNOSIS — M25552 Pain in left hip: Secondary | ICD-10-CM | POA: Diagnosis not present

## 2019-05-16 DIAGNOSIS — Z9189 Other specified personal risk factors, not elsewhere classified: Secondary | ICD-10-CM | POA: Diagnosis not present

## 2019-05-16 DIAGNOSIS — F331 Major depressive disorder, recurrent, moderate: Secondary | ICD-10-CM | POA: Diagnosis not present

## 2019-05-16 DIAGNOSIS — F411 Generalized anxiety disorder: Secondary | ICD-10-CM | POA: Diagnosis not present

## 2019-05-16 DIAGNOSIS — F4310 Post-traumatic stress disorder, unspecified: Secondary | ICD-10-CM | POA: Diagnosis not present

## 2019-05-19 DIAGNOSIS — M25511 Pain in right shoulder: Secondary | ICD-10-CM | POA: Diagnosis not present

## 2019-05-19 DIAGNOSIS — M25552 Pain in left hip: Secondary | ICD-10-CM | POA: Diagnosis not present

## 2019-05-19 DIAGNOSIS — M797 Fibromyalgia: Secondary | ICD-10-CM | POA: Diagnosis not present

## 2019-05-19 DIAGNOSIS — F4310 Post-traumatic stress disorder, unspecified: Secondary | ICD-10-CM | POA: Diagnosis not present

## 2019-05-19 DIAGNOSIS — Z79899 Other long term (current) drug therapy: Secondary | ICD-10-CM | POA: Diagnosis not present

## 2019-06-02 DIAGNOSIS — Z79899 Other long term (current) drug therapy: Secondary | ICD-10-CM | POA: Diagnosis not present

## 2019-06-02 DIAGNOSIS — M25552 Pain in left hip: Secondary | ICD-10-CM | POA: Diagnosis not present

## 2019-06-02 DIAGNOSIS — M797 Fibromyalgia: Secondary | ICD-10-CM | POA: Diagnosis not present

## 2019-06-02 DIAGNOSIS — F4310 Post-traumatic stress disorder, unspecified: Secondary | ICD-10-CM | POA: Diagnosis not present

## 2019-06-03 DIAGNOSIS — M25511 Pain in right shoulder: Secondary | ICD-10-CM | POA: Diagnosis not present

## 2019-06-03 DIAGNOSIS — M6281 Muscle weakness (generalized): Secondary | ICD-10-CM | POA: Diagnosis not present

## 2019-06-03 DIAGNOSIS — M25512 Pain in left shoulder: Secondary | ICD-10-CM | POA: Diagnosis not present

## 2019-06-03 DIAGNOSIS — M5442 Lumbago with sciatica, left side: Secondary | ICD-10-CM | POA: Diagnosis not present

## 2019-06-13 DIAGNOSIS — F4310 Post-traumatic stress disorder, unspecified: Secondary | ICD-10-CM | POA: Diagnosis not present

## 2019-06-13 DIAGNOSIS — F331 Major depressive disorder, recurrent, moderate: Secondary | ICD-10-CM | POA: Diagnosis not present

## 2019-06-13 DIAGNOSIS — F411 Generalized anxiety disorder: Secondary | ICD-10-CM | POA: Diagnosis not present

## 2019-06-20 DIAGNOSIS — M5442 Lumbago with sciatica, left side: Secondary | ICD-10-CM | POA: Diagnosis not present

## 2019-06-20 DIAGNOSIS — M6281 Muscle weakness (generalized): Secondary | ICD-10-CM | POA: Diagnosis not present

## 2019-06-20 DIAGNOSIS — M25511 Pain in right shoulder: Secondary | ICD-10-CM | POA: Diagnosis not present

## 2019-06-20 DIAGNOSIS — M25512 Pain in left shoulder: Secondary | ICD-10-CM | POA: Diagnosis not present

## 2019-06-27 ENCOUNTER — Other Ambulatory Visit: Payer: Self-pay

## 2019-06-27 ENCOUNTER — Ambulatory Visit (INDEPENDENT_AMBULATORY_CARE_PROVIDER_SITE_OTHER): Payer: BC Managed Care – PPO | Admitting: Family Medicine

## 2019-06-27 ENCOUNTER — Encounter: Payer: Self-pay | Admitting: Family Medicine

## 2019-06-27 VITALS — BP 106/76 | HR 97 | Temp 99.2°F | Resp 12 | Ht 72.0 in | Wt 155.6 lb

## 2019-06-27 DIAGNOSIS — M25512 Pain in left shoulder: Secondary | ICD-10-CM | POA: Diagnosis not present

## 2019-06-27 DIAGNOSIS — M6281 Muscle weakness (generalized): Secondary | ICD-10-CM | POA: Diagnosis not present

## 2019-06-27 DIAGNOSIS — M25511 Pain in right shoulder: Secondary | ICD-10-CM | POA: Diagnosis not present

## 2019-06-27 DIAGNOSIS — B029 Zoster without complications: Secondary | ICD-10-CM | POA: Diagnosis not present

## 2019-06-27 DIAGNOSIS — G894 Chronic pain syndrome: Secondary | ICD-10-CM | POA: Diagnosis not present

## 2019-06-27 DIAGNOSIS — M5442 Lumbago with sciatica, left side: Secondary | ICD-10-CM | POA: Diagnosis not present

## 2019-06-27 MED ORDER — PREDNISONE 20 MG PO TABS: 40.0000 mg | ORAL_TABLET | Freq: Every day | ORAL | 0 refills | Status: AC

## 2019-06-27 MED ORDER — OXYCODONE HCL 10 MG PO TABS: 5.0000 mg | ORAL_TABLET | Freq: Four times a day (QID) | ORAL | 0 refills | Status: DC | PRN

## 2019-06-27 MED ORDER — GABAPENTIN 300 MG PO CAPS: 300.0000 mg | ORAL_CAPSULE | Freq: Three times a day (TID) | ORAL | 0 refills | Status: DC

## 2019-06-27 MED ORDER — VALACYCLOVIR HCL 1 G PO TABS: 1000.0000 mg | ORAL_TABLET | Freq: Three times a day (TID) | ORAL | 0 refills | Status: AC

## 2019-06-27 NOTE — Progress Notes (Signed)
Chief Complaint  Patient presents with  . Back Pain    ?shingles  fever    Gail Watkins is a 44 y.o. female here for a skin complaint. Here w spouse.   Duration: 1 week Location: back  Pruritic? Yes Painful? Yes Drainage? No New soaps/lotions/topicals/detergents? No Sick contacts? No Other associated symptoms: Blistering Therapies tried thus far: gabapentin  Pt w chronic pain syndrome, took a single tramadol and was kicked out of pain clinic. She was undergoing workup for shoulder pain there. Requesting refill of oxy.   Past Medical History:  Diagnosis Date  . AC (acromioclavicular) joint bone spurs, unspecified laterality    in neck  . Allergy   . Depression   . Eating disorder   . Fibromyalgia   . Hay fever   . History of chicken pox   . History of fainting spells of unknown cause   . Migraine   . Osteoarthritis   . Osteopenia   . Prediabetes   . PTSD (post-traumatic stress disorder)   . Sacroiliitis (HCC)   . Scheurmann's disease     BP 106/76 (BP Location: Left Arm, Cuff Size: Normal)   Pulse 97   Temp 99.2 F (37.3 C) (Temporal)   Resp 12   Ht 6' (1.829 m)   Wt 155 lb 9.6 oz (70.6 kg)   SpO2 97%   BMI 21.10 kg/m  Gen: awake, alert, appearing stated age Lungs: No accessory muscle use Skin: small pustules and erythematous papules on mid back at approx T8-9. No drainage, erythema, fluctuance; +TTP Psych: Age appropriate judgment and insight; tearful at times  Herpes zoster without complication - Plan: predniSONE (DELTASONE) 20 MG tablet, valACYclovir (VALTREX) 1000 MG tablet, gabapentin (NEURONTIN) 300 MG capsule  Chronic pain syndrome - Plan: Ambulatory referral to Pain Clinic, Oxycodone HCl 10 MG TABS, Oxycodone HCl 10 MG TABS  1- Gabapentin, pred burst, 7 d course of Valtrex.  2- Refill 2 mo of oxy, refer to pain management again.  F/u prn. The patient voiced understanding and agreement to the plan.  Jilda Roche [place], DO 06/27/19 4:03  PM

## 2019-06-27 NOTE — Patient Instructions (Addendum)
If you do not hear anything about your referral in the next 1-2 weeks, call our office and ask for an update.  I will bridge you with pain medicine until you can get back into a new pain clinic.    Be careful of the gabapentin as it can make you drowsy.  Let us know if you need anything.

## 2019-07-01 ENCOUNTER — Telehealth: Payer: Self-pay

## 2019-07-01 NOTE — Telephone Encounter (Signed)
Patient called in to check on the paper work for her handy [employer] Parking placard. Please give the patient a call as soon as possible at [phone] or [phone]   When ever  the paper work is ready for pick up.

## 2019-07-01 NOTE — Telephone Encounter (Signed)
Paperwork has been filled out and given to the patient's husband.

## 2019-07-06 DIAGNOSIS — M25512 Pain in left shoulder: Secondary | ICD-10-CM | POA: Diagnosis not present

## 2019-07-06 DIAGNOSIS — M6281 Muscle weakness (generalized): Secondary | ICD-10-CM | POA: Diagnosis not present

## 2019-07-06 DIAGNOSIS — M25511 Pain in right shoulder: Secondary | ICD-10-CM | POA: Diagnosis not present

## 2019-07-06 DIAGNOSIS — M5442 Lumbago with sciatica, left side: Secondary | ICD-10-CM | POA: Diagnosis not present

## 2019-07-12 DIAGNOSIS — M25511 Pain in right shoulder: Secondary | ICD-10-CM | POA: Diagnosis not present

## 2019-07-12 DIAGNOSIS — M6281 Muscle weakness (generalized): Secondary | ICD-10-CM | POA: Diagnosis not present

## 2019-07-12 DIAGNOSIS — M5442 Lumbago with sciatica, left side: Secondary | ICD-10-CM | POA: Diagnosis not present

## 2019-07-12 DIAGNOSIS — M25512 Pain in left shoulder: Secondary | ICD-10-CM | POA: Diagnosis not present

## 2019-07-14 DIAGNOSIS — M5442 Lumbago with sciatica, left side: Secondary | ICD-10-CM | POA: Diagnosis not present

## 2019-07-14 DIAGNOSIS — M25511 Pain in right shoulder: Secondary | ICD-10-CM | POA: Diagnosis not present

## 2019-07-14 DIAGNOSIS — M25512 Pain in left shoulder: Secondary | ICD-10-CM | POA: Diagnosis not present

## 2019-07-14 DIAGNOSIS — M6281 Muscle weakness (generalized): Secondary | ICD-10-CM | POA: Diagnosis not present

## 2019-07-19 DIAGNOSIS — M25511 Pain in right shoulder: Secondary | ICD-10-CM | POA: Diagnosis not present

## 2019-07-19 DIAGNOSIS — M5442 Lumbago with sciatica, left side: Secondary | ICD-10-CM | POA: Diagnosis not present

## 2019-07-19 DIAGNOSIS — M25512 Pain in left shoulder: Secondary | ICD-10-CM | POA: Diagnosis not present

## 2019-07-19 DIAGNOSIS — M6281 Muscle weakness (generalized): Secondary | ICD-10-CM | POA: Diagnosis not present

## 2019-07-25 DIAGNOSIS — M5442 Lumbago with sciatica, left side: Secondary | ICD-10-CM | POA: Diagnosis not present

## 2019-07-25 DIAGNOSIS — M25512 Pain in left shoulder: Secondary | ICD-10-CM | POA: Diagnosis not present

## 2019-07-25 DIAGNOSIS — M6281 Muscle weakness (generalized): Secondary | ICD-10-CM | POA: Diagnosis not present

## 2019-07-25 DIAGNOSIS — M25511 Pain in right shoulder: Secondary | ICD-10-CM | POA: Diagnosis not present

## 2019-07-27 ENCOUNTER — Other Ambulatory Visit: Payer: Self-pay | Admitting: Family Medicine

## 2019-07-27 DIAGNOSIS — M25511 Pain in right shoulder: Secondary | ICD-10-CM | POA: Diagnosis not present

## 2019-07-27 DIAGNOSIS — M5442 Lumbago with sciatica, left side: Secondary | ICD-10-CM | POA: Diagnosis not present

## 2019-07-27 DIAGNOSIS — M6281 Muscle weakness (generalized): Secondary | ICD-10-CM | POA: Diagnosis not present

## 2019-07-27 DIAGNOSIS — M25512 Pain in left shoulder: Secondary | ICD-10-CM | POA: Diagnosis not present

## 2019-08-02 ENCOUNTER — Other Ambulatory Visit: Payer: Self-pay | Admitting: Family Medicine

## 2019-08-02 DIAGNOSIS — M25512 Pain in left shoulder: Secondary | ICD-10-CM | POA: Diagnosis not present

## 2019-08-02 DIAGNOSIS — M6281 Muscle weakness (generalized): Secondary | ICD-10-CM | POA: Diagnosis not present

## 2019-08-02 DIAGNOSIS — M25511 Pain in right shoulder: Secondary | ICD-10-CM | POA: Diagnosis not present

## 2019-08-02 DIAGNOSIS — G894 Chronic pain syndrome: Secondary | ICD-10-CM

## 2019-08-02 DIAGNOSIS — M5442 Lumbago with sciatica, left side: Secondary | ICD-10-CM | POA: Diagnosis not present

## 2019-08-04 ENCOUNTER — Other Ambulatory Visit: Payer: Self-pay | Admitting: Diagnostic Neuroimaging

## 2019-08-04 DIAGNOSIS — M6281 Muscle weakness (generalized): Secondary | ICD-10-CM | POA: Diagnosis not present

## 2019-08-04 DIAGNOSIS — M25511 Pain in right shoulder: Secondary | ICD-10-CM | POA: Diagnosis not present

## 2019-08-04 DIAGNOSIS — M25512 Pain in left shoulder: Secondary | ICD-10-CM | POA: Diagnosis not present

## 2019-08-04 DIAGNOSIS — M5442 Lumbago with sciatica, left side: Secondary | ICD-10-CM | POA: Diagnosis not present

## 2019-08-04 NOTE — Telephone Encounter (Signed)
Pt called wanting to know why prescription refill request was denied. I advised last rx sent 04/11/19 for 90 days supply with 4 refills. Pt states she only has been getting 30 days at a time. I advised she will have to speak with pharmacy to see why that is. But there should be refills still for her to pick up. She will contact pharmacy and call our office back if anything further is needed.

## 2019-08-10 ENCOUNTER — Other Ambulatory Visit: Payer: Self-pay | Admitting: Family Medicine

## 2019-08-10 DIAGNOSIS — M7541 Impingement syndrome of right shoulder: Secondary | ICD-10-CM | POA: Diagnosis not present

## 2019-08-10 DIAGNOSIS — G894 Chronic pain syndrome: Secondary | ICD-10-CM

## 2019-08-10 DIAGNOSIS — M25511 Pain in right shoulder: Secondary | ICD-10-CM | POA: Diagnosis not present

## 2019-08-10 NOTE — Progress Notes (Signed)
refe

## 2019-08-16 ENCOUNTER — Other Ambulatory Visit: Payer: Self-pay | Admitting: Family Medicine

## 2019-08-16 DIAGNOSIS — M7540 Impingement syndrome of unspecified shoulder: Secondary | ICD-10-CM

## 2019-08-16 NOTE — Progress Notes (Unsigned)
referral

## 2019-08-17 ENCOUNTER — Other Ambulatory Visit: Payer: Self-pay | Admitting: Family Medicine

## 2019-08-17 MED ORDER — ESCITALOPRAM OXALATE 20 MG PO TABS: 20.0000 mg | ORAL_TABLET | Freq: Every day | ORAL | 3 refills | Status: DC

## 2019-08-19 ENCOUNTER — Other Ambulatory Visit: Payer: Self-pay | Admitting: Family Medicine

## 2019-08-19 DIAGNOSIS — G894 Chronic pain syndrome: Secondary | ICD-10-CM

## 2019-08-22 ENCOUNTER — Telehealth: Payer: Self-pay | Admitting: Family Medicine

## 2019-08-22 NOTE — Telephone Encounter (Signed)
Per Judeth Cornfield @ [facility] in [place] info: [email or web address] Address: [address] [place], [place] [address] Hours:  Open ? Closes 6PM Phone: [phone]  This a Physical Therapy,per stephanie was patient referred to correct office  Please advise

## 2019-08-24 ENCOUNTER — Telehealth: Payer: Self-pay | Admitting: Family Medicine

## 2019-08-24 DIAGNOSIS — G894 Chronic pain syndrome: Secondary | ICD-10-CM

## 2019-08-24 MED ORDER — OXYCODONE HCL 10 MG PO TABS: 5.0000 mg | ORAL_TABLET | Freq: Four times a day (QID) | ORAL | 0 refills | Status: DC | PRN

## 2019-08-24 NOTE — Telephone Encounter (Signed)
Refill request for Oxycodone Last OV---06/27/19 No future appt scheduled. Last RF---#90 no refills on 07/27/19 UDS--02/08/19 CSC--11/29/2018

## 2019-08-31 DIAGNOSIS — M25511 Pain in right shoulder: Secondary | ICD-10-CM | POA: Diagnosis not present

## 2019-08-31 DIAGNOSIS — M6281 Muscle weakness (generalized): Secondary | ICD-10-CM | POA: Diagnosis not present

## 2019-08-31 DIAGNOSIS — M25611 Stiffness of right shoulder, not elsewhere classified: Secondary | ICD-10-CM | POA: Diagnosis not present

## 2019-09-09 DIAGNOSIS — M25611 Stiffness of right shoulder, not elsewhere classified: Secondary | ICD-10-CM | POA: Diagnosis not present

## 2019-09-09 DIAGNOSIS — M25511 Pain in right shoulder: Secondary | ICD-10-CM | POA: Diagnosis not present

## 2019-09-09 DIAGNOSIS — M6281 Muscle weakness (generalized): Secondary | ICD-10-CM | POA: Diagnosis not present

## 2019-09-14 DIAGNOSIS — M6281 Muscle weakness (generalized): Secondary | ICD-10-CM | POA: Diagnosis not present

## 2019-09-14 DIAGNOSIS — M25511 Pain in right shoulder: Secondary | ICD-10-CM | POA: Diagnosis not present

## 2019-09-14 DIAGNOSIS — M25611 Stiffness of right shoulder, not elsewhere classified: Secondary | ICD-10-CM | POA: Diagnosis not present

## 2019-09-16 DIAGNOSIS — M6281 Muscle weakness (generalized): Secondary | ICD-10-CM | POA: Diagnosis not present

## 2019-09-16 DIAGNOSIS — M25611 Stiffness of right shoulder, not elsewhere classified: Secondary | ICD-10-CM | POA: Diagnosis not present

## 2019-09-16 DIAGNOSIS — M25511 Pain in right shoulder: Secondary | ICD-10-CM | POA: Diagnosis not present

## 2019-09-20 ENCOUNTER — Telehealth: Payer: Self-pay | Admitting: Family Medicine

## 2019-09-20 ENCOUNTER — Other Ambulatory Visit: Payer: Self-pay | Admitting: Family Medicine

## 2019-09-20 DIAGNOSIS — G894 Chronic pain syndrome: Secondary | ICD-10-CM

## 2019-09-20 MED ORDER — OXYCODONE HCL 10 MG PO TABS: 5.0000 mg | ORAL_TABLET | Freq: Four times a day (QID) | ORAL | 0 refills | Status: DC | PRN

## 2019-09-20 NOTE — Telephone Encounter (Signed)
Update on pain clinic?

## 2019-09-20 NOTE — Telephone Encounter (Signed)
PT is doing injections? She needs to see pain management, not just PT. Ty.

## 2019-09-20 NOTE — Telephone Encounter (Signed)
Have put in another referral for pain clinic (urgent) The patient did want to know if you would refill her Oxycodone She made an appt for 10/2019 with PCP

## 2019-09-20 NOTE — Addendum Note (Signed)
Addended by: Radene Gunning on: 09/20/2019 04:55 PM   Modules accepted: Orders

## 2019-09-20 NOTE — Progress Notes (Signed)
.  pain

## 2019-09-20 NOTE — Telephone Encounter (Signed)
Oxycodone refill Last OV--06/27/19 Last RF--08/26/19-- #90 no refills UDS--02/08/19 CSC--11/29/2018

## 2019-09-20 NOTE — Telephone Encounter (Signed)
A referral for pain clinic was put in on 8/4, but was sent to [facility] PT. The patient stated she is going to PT getting injections, but no pain management.

## 2019-09-21 DIAGNOSIS — M7541 Impingement syndrome of right shoulder: Secondary | ICD-10-CM | POA: Diagnosis not present

## 2019-09-21 DIAGNOSIS — M25511 Pain in right shoulder: Secondary | ICD-10-CM | POA: Diagnosis not present

## 2019-09-23 DIAGNOSIS — M25611 Stiffness of right shoulder, not elsewhere classified: Secondary | ICD-10-CM | POA: Diagnosis not present

## 2019-09-23 DIAGNOSIS — M6281 Muscle weakness (generalized): Secondary | ICD-10-CM | POA: Diagnosis not present

## 2019-09-23 DIAGNOSIS — M25511 Pain in right shoulder: Secondary | ICD-10-CM | POA: Diagnosis not present

## 2019-09-28 DIAGNOSIS — M25511 Pain in right shoulder: Secondary | ICD-10-CM | POA: Diagnosis not present

## 2019-10-07 ENCOUNTER — Encounter: Payer: Self-pay | Admitting: Family Medicine

## 2019-10-07 ENCOUNTER — Ambulatory Visit (INDEPENDENT_AMBULATORY_CARE_PROVIDER_SITE_OTHER): Payer: BC Managed Care – PPO | Admitting: Family Medicine

## 2019-10-07 ENCOUNTER — Other Ambulatory Visit: Payer: Self-pay

## 2019-10-07 VITALS — BP 120/72 | HR 84 | Temp 98.2°F | Ht 72.0 in | Wt 149.2 lb

## 2019-10-07 DIAGNOSIS — F4310 Post-traumatic stress disorder, unspecified: Secondary | ICD-10-CM | POA: Diagnosis not present

## 2019-10-07 MED ORDER — BUPROPION HCL ER (XL) 150 MG PO TB24: 150.0000 mg | ORAL_TABLET | Freq: Every day | ORAL | 3 refills | Status: DC

## 2019-10-07 MED ORDER — ALBUTEROL SULFATE HFA 108 (90 BASE) MCG/ACT IN AERS: 2.0000 | INHALATION_SPRAY | Freq: Four times a day (QID) | RESPIRATORY_TRACT | 0 refills | Status: AC | PRN

## 2019-10-07 NOTE — Patient Instructions (Signed)
If you do not hear anything about your referral in the next 1-2 weeks, call our office and ask for an update.  Let me know if your pain picks up again off of the Cymbalta and we can send it back in.  Stay active.   Let us know if you need anything.

## 2019-10-07 NOTE — Progress Notes (Signed)
Chief Complaint  Patient presents with   Follow-up    Subjective Gail Watkins presents for f/u PTSD.  Pt is currently being treated with Cymbalta 60 mg/d, Lexapro 20 mg/d.  Reports doing poorly. No thoughts of harming self or others. No self-medication with alcohol, prescription drugs or illicit drugs. Pt is not following with a counselor/psychologist. She is interested in seeing both a psychologist and psychiatrist  Past Medical History:  Diagnosis Date   AC (acromioclavicular) joint bone spurs, unspecified laterality    in neck   Allergy    Depression    Eating disorder    Fibromyalgia    Hay fever    History of chicken pox    History of fainting spells of unknown cause    Migraine    Osteoarthritis    Osteopenia    Prediabetes    PTSD (post-traumatic stress disorder)    Sacroiliitis (HCC)    Scheurmann's disease      Exam BP 120/72 (BP Location: Left Arm, Patient Position: Sitting, Cuff Size: Normal)    Pulse 84    Temp 98.2 F (36.8 C) (Oral)    Ht 6' (1.829 m)    Wt 149 lb 4 oz (67.7 kg)    SpO2 99%    BMI 20.24 kg/m  General:  well developed, well nourished, in no apparent distress Heart: RRR Lungs:  CTAB. No respiratory distress Psych: well oriented with normal range of affect and age-appropriate judgement/insight, alert and oriented x4.  Assessment and Plan  PTSD (post-traumatic stress disorder) - Plan: Ambulatory referral to Psychiatry, Ambulatory referral to Psychology, buPROPion (WELLBUTRIN XL) 150 MG 24 hr tablet  Uncontrolled. Referrals as above. Stop Cymbalta. Add buproprion. Would consider apriprazole vs quetiapine if no improvement.  F/u in 4-6 weeks if not in w psych yet. The patient voiced understanding and agreement to the plan.  Jilda Roche [place], DO 10/07/19 1:12 PM

## 2019-10-10 DIAGNOSIS — M25511 Pain in right shoulder: Secondary | ICD-10-CM | POA: Diagnosis not present

## 2019-10-10 DIAGNOSIS — M5412 Radiculopathy, cervical region: Secondary | ICD-10-CM | POA: Diagnosis not present

## 2019-10-10 DIAGNOSIS — M7541 Impingement syndrome of right shoulder: Secondary | ICD-10-CM | POA: Diagnosis not present

## 2019-10-13 DIAGNOSIS — M2550 Pain in unspecified joint: Secondary | ICD-10-CM | POA: Diagnosis not present

## 2019-10-19 DIAGNOSIS — M797 Fibromyalgia: Secondary | ICD-10-CM | POA: Diagnosis not present

## 2019-10-19 DIAGNOSIS — Z1389 Encounter for screening for other disorder: Secondary | ICD-10-CM | POA: Diagnosis not present

## 2019-10-19 DIAGNOSIS — G894 Chronic pain syndrome: Secondary | ICD-10-CM | POA: Diagnosis not present

## 2019-10-19 DIAGNOSIS — M461 Sacroiliitis, not elsewhere classified: Secondary | ICD-10-CM | POA: Diagnosis not present

## 2019-10-21 ENCOUNTER — Telehealth: Payer: Self-pay | Admitting: Family Medicine

## 2019-10-21 DIAGNOSIS — G894 Chronic pain syndrome: Secondary | ICD-10-CM

## 2019-10-21 NOTE — Telephone Encounter (Signed)
Medication: Oxycodone HCl 10 MG TABS [[id number]]       Has the patient contacted their pharmacy?  (If no, request that the patient contact the pharmacy for the refill.) (If yes, when and what did the pharmacy advise?)     Preferred Pharmacy (with phone number or street name): [facility] [place] - [address]  [address] [place], [place] [address]  Phone:  [phone] Fax:  [phone]      Agent: Please be advised that RX refills may take up to 3 business days. We ask that you follow-up with your pharmacy.

## 2019-10-21 NOTE — Telephone Encounter (Signed)
Last RF--09/23/2019  #90 Last OV--10/07/2019 CSC--11/29/2018 UDS--02/08/2019  E-Script still down

## 2019-10-23 MED ORDER — OXYCODONE HCL 10 MG PO TABS: 5.0000 mg | ORAL_TABLET | Freq: Four times a day (QID) | ORAL | 0 refills | Status: DC | PRN

## 2019-10-24 ENCOUNTER — Telehealth: Payer: Self-pay

## 2019-10-24 NOTE — Telephone Encounter (Signed)
Caller says she is out of her medication, and will not be able to do her job if she doesn't get it refilled---want to know if it was sent to pharmacy, no symptoms, Caller says her pharmacy closes in 30 mins.  RX sent on 10/21/2019.

## 2019-11-10 DIAGNOSIS — M25511 Pain in right shoulder: Secondary | ICD-10-CM | POA: Diagnosis not present

## 2019-11-10 DIAGNOSIS — M25611 Stiffness of right shoulder, not elsewhere classified: Secondary | ICD-10-CM | POA: Diagnosis not present

## 2019-11-10 DIAGNOSIS — M6281 Muscle weakness (generalized): Secondary | ICD-10-CM | POA: Diagnosis not present

## 2019-11-21 ENCOUNTER — Ambulatory Visit (INDEPENDENT_AMBULATORY_CARE_PROVIDER_SITE_OTHER): Payer: BC Managed Care – PPO | Admitting: Family Medicine

## 2019-11-21 ENCOUNTER — Other Ambulatory Visit: Payer: Self-pay

## 2019-11-21 ENCOUNTER — Encounter: Payer: Self-pay | Admitting: Family Medicine

## 2019-11-21 VITALS — BP 110/78 | HR 77 | Temp 98.8°F | Resp 12 | Ht 72.0 in | Wt 154.0 lb

## 2019-11-21 DIAGNOSIS — G894 Chronic pain syndrome: Secondary | ICD-10-CM | POA: Diagnosis not present

## 2019-11-21 DIAGNOSIS — F4310 Post-traumatic stress disorder, unspecified: Secondary | ICD-10-CM

## 2019-11-21 MED ORDER — ARIPIPRAZOLE 2 MG PO TABS: 2.0000 mg | ORAL_TABLET | Freq: Every day | ORAL | 2 refills | Status: AC

## 2019-11-21 MED ORDER — OXYCODONE HCL 10 MG PO TABS: 5.0000 mg | ORAL_TABLET | Freq: Four times a day (QID) | ORAL | 0 refills | Status: DC | PRN

## 2019-11-21 NOTE — Patient Instructions (Addendum)
If you do not hear anything about your referral in the next 1-2 weeks, call our office and ask for an update.  Aim to do some physical exertion for 150 minutes per week. This is typically divided into 5 days per week, 30 minutes per day. The activity should be enough to get your heart rate up. Anything is better than nothing if you have time constraints.  Let us know if you need anything.  

## 2019-11-21 NOTE — Progress Notes (Signed)
Chief Complaint  Patient presents with  . Post-Traumatic Stress Disorder    follow up-stopped the wellbutrin because of hallucinations    Subjective Gail Watkins presents for f/u PTSD. Here w husband.   Pt is currently being treated with Lexapro 20 mg/d. Started on Wellbutrin and didn't do well.  Reports not doing well since treatment. No thoughts of harming self or others. No self-medication with alcohol, prescription drugs or illicit drugs. Seroquel and Remeron caused wt gain, just failed Wellbutrin, Lamictal caused low blood pressure, Depakote caused her to have sedation Pt is not following with a counselor/psychologist.  Past Medical History:  Diagnosis Date  . AC (acromioclavicular) joint bone spurs, unspecified laterality    in neck  . Allergy   . Depression   . Eating disorder   . Fibromyalgia   . Hay fever   . History of chicken pox   . History of fainting spells of unknown cause   . Migraine   . Osteoarthritis   . Osteopenia   . Prediabetes   . PTSD (post-traumatic stress disorder)   . Sacroiliitis (HCC)   . Scheurmann's disease    Allergies as of 11/21/2019      Reactions   Sulfa Antibiotics Anaphylaxis   PO only, does fine w topical   Vancomycin Anaphylaxis   Reglan [metoclopramide]    "it makes me feel like im crawling out of my skin"   Terbutaline    "it makes me feel like im crawling out of my skin"   Wellbutrin [bupropion] Other (See Comments)   hallucinations      Medication List       Accurate as of November 21, 2019 12:20 PM. If you have any questions, ask your nurse or doctor.        STOP taking these medications   buPROPion 150 MG 24 hr tablet Commonly known as: Wellbutrin XL Stopped by: Gail Dory, DO     TAKE these medications   Aimovig 70 MG/ML Soaj Generic drug: Erenumab-aooe Inject 70 mg into the skin every 30 (thirty) days.   albuterol 108 (90 Base) MCG/ACT inhaler Commonly known as: VENTOLIN HFA Inhale 2 puffs  into the lungs every 6 (six) hours as needed for wheezing or shortness of breath.   ARIPiprazole 2 MG tablet Commonly known as: Abilify Take 1 tablet (2 mg total) by mouth daily. Started by: Gail Dory, DO   calcium-vitamin D 500-200 MG-UNIT tablet Commonly known as: OSCAL WITH D Take 1 tablet by mouth.   dicyclomine 10 MG capsule Commonly known as: BENTYL TAKE 1 CAPSULE(10 MG) BY MOUTH FOUR TIMES DAILY BEFORE MEALS AND AT BEDTIME   escitalopram 20 MG tablet Commonly known as: LEXAPRO Take 1 tablet (20 mg total) by mouth daily.   gabapentin 300 MG capsule Commonly known as: NEURONTIN Take 1 capsule (300 mg total) by mouth 3 (three) times daily.   Lidocaine 4 % Aero Apply 1-2 sprays topically 4 (four) times daily as needed (Pain).   melatonin 5 MG Tabs Take 10 tablets by mouth at bedtime.   meloxicam 15 MG tablet Commonly known as: MOBIC TAKE 1 TABLET(15 MG) BY MOUTH DAILY   omeprazole 40 MG capsule Commonly known as: PRILOSEC Take 1 capsule (40 mg total) by mouth daily.   Oxycodone HCl 10 MG Tabs Take 0.5-1 tablets (5-10 mg total) by mouth every 6 (six) hours as needed (Pain).   rizatriptan 10 MG disintegrating tablet Commonly known as: MAXALT-MLT Take 1 tablet (10  mg total) by mouth as needed for migraine. May repeat in 2 hours if needed   tiZANidine 4 MG tablet Commonly known as: ZANAFLEX Take 1-2 tabs every 8 hours.   topiramate 100 MG tablet Commonly known as: TOPAMAX Take 1 tablet (100 mg total) by mouth 2 (two) times daily.       Exam BP 110/78 (BP Location: Left Arm, Cuff Size: Large)   Pulse 77   Temp 98.8 F (37.1 C) (Oral)   Resp 12   Ht 6' (1.829 m)   Wt 154 lb (69.9 kg)   SpO2 96%   BMI 20.89 kg/m  General:  well developed, well nourished, in no apparent distress Lungs:  No respiratory distress Psych: well oriented with normal range of affect and age-appropriate judgement/insight, alert and oriented x4.  Assessment and  Plan  PTSD (post-traumatic stress disorder) - Plan: Ambulatory referral to Psychiatry, ARIPiprazole (ABILIFY) 2 MG tablet  Chronic pain syndrome - Plan: Oxycodone HCl 10 MG TABS  Will refer to psych again.  Continue Lexapro 20 mg daily.  Add Abilify 2 mg daily.  This more weight neutral and Seroquel.  I will see her in 6 weeks if she is not able to get in with a psychiatrist in a reasonable timeframe. The patient voiced understanding and agreement to the plan.  Jilda Roche [place], DO 11/21/19 12:20 PM

## 2019-11-29 ENCOUNTER — Telehealth: Payer: Self-pay | Admitting: Family Medicine

## 2019-11-29 NOTE — Telephone Encounter (Signed)
Deny for now, psych will hopefully find better combinations, but that is too similar to Lexapro for me to want to refill. Ty.

## 2019-11-29 NOTE — Telephone Encounter (Signed)
Called left message to call back 

## 2019-11-29 NOTE — Telephone Encounter (Signed)
Patient/pharmacy requesting refill on Duloxetine 60 mg--Not on current list

## 2019-11-29 NOTE — Telephone Encounter (Signed)
Patient informed of PCP instructions. The patient stated she has been taking Lexapro in the morning, Cymbalta in the afternoon and Abilify at night before bed.  She states that she has not been able to find a psychiatrist yet and has a lot going on in her life right now.  She states adding the Abilify she is now sleeping much better. She does not want to come off the cymbalta

## 2019-11-30 MED ORDER — DULOXETINE HCL 60 MG PO CPEP: 60.0000 mg | ORAL_CAPSULE | Freq: Every day | ORAL | 3 refills | Status: AC

## 2019-11-30 NOTE — Telephone Encounter (Signed)
Will refill, have her watch out for shaking, palpitations and diarrhea. Ty.

## 2019-11-30 NOTE — Telephone Encounter (Signed)
Patient informed of PCP instructions. She verbalized understanding. 

## 2019-12-06 DIAGNOSIS — Z79899 Other long term (current) drug therapy: Secondary | ICD-10-CM | POA: Diagnosis not present

## 2019-12-06 DIAGNOSIS — E119 Type 2 diabetes mellitus without complications: Secondary | ICD-10-CM | POA: Diagnosis not present

## 2019-12-06 DIAGNOSIS — F17210 Nicotine dependence, cigarettes, uncomplicated: Secondary | ICD-10-CM | POA: Diagnosis not present

## 2019-12-06 DIAGNOSIS — X19XXXA Contact with other heat and hot substances, initial encounter: Secondary | ICD-10-CM | POA: Diagnosis not present

## 2019-12-06 DIAGNOSIS — Z79891 Long term (current) use of opiate analgesic: Secondary | ICD-10-CM | POA: Diagnosis not present

## 2019-12-06 DIAGNOSIS — M797 Fibromyalgia: Secondary | ICD-10-CM | POA: Diagnosis not present

## 2019-12-06 DIAGNOSIS — T23221A Burn of second degree of single right finger (nail) except thumb, initial encounter: Secondary | ICD-10-CM | POA: Diagnosis not present

## 2019-12-06 DIAGNOSIS — G8929 Other chronic pain: Secondary | ICD-10-CM | POA: Diagnosis not present

## 2019-12-09 ENCOUNTER — Telehealth: Payer: Self-pay | Admitting: Family Medicine

## 2019-12-09 NOTE — Telephone Encounter (Signed)
Maggie from [facility] called in reference to a request they received for medical records , Per maggie they need to know the specific date in which we needs records from.  Please Advise   Her direct dail lis [phone] ext [phone]

## 2019-12-09 NOTE — Telephone Encounter (Signed)
LM with Gail Watkins at [facility] requesting call back for more information.

## 2019-12-09 NOTE — Telephone Encounter (Signed)
Spoke with Maggie. Records request is for office visits at Ortho to be sent to PCP.  Maggie faxing OVs from this year and MRI result.

## 2019-12-14 ENCOUNTER — Other Ambulatory Visit: Payer: Self-pay | Admitting: Family Medicine

## 2019-12-14 MED ORDER — DICYCLOMINE HCL 10 MG PO CAPS: ORAL_CAPSULE | ORAL | 5 refills | Status: AC

## 2019-12-19 ENCOUNTER — Telehealth: Payer: Self-pay | Admitting: Family Medicine

## 2019-12-19 DIAGNOSIS — G894 Chronic pain syndrome: Secondary | ICD-10-CM

## 2019-12-19 MED ORDER — OXYCODONE HCL 10 MG PO TABS: 5.0000 mg | ORAL_TABLET | Freq: Four times a day (QID) | ORAL | 0 refills | Status: DC | PRN

## 2019-12-19 NOTE — Telephone Encounter (Signed)
She stated they told her they would call it in but they never did on previous refill in November. She does not have an upcoming appointment with them and she did not ask them for the December refill.  She said they never return her phone calls.

## 2019-12-19 NOTE — Telephone Encounter (Signed)
I can fill this one, but is her new pain specialist prescribing?

## 2019-12-19 NOTE — Telephone Encounter (Signed)
She needs to contact them. Ty.

## 2019-12-19 NOTE — Telephone Encounter (Signed)
Patient called back inrefence to pain medication being sent to pharmacy.patient states pharmacy hasn't not received

## 2019-12-19 NOTE — Telephone Encounter (Signed)
Called the patient back and she states they will not fill her medication because she no showed. She states they will not return her calls.  Did tell her PCP response to this message and she did ask if you would fill now so she is not hit with a baseball bat, since she is out as of today. I did let her know her options and she did not respond but only asked for the refill for today

## 2019-12-19 NOTE — Telephone Encounter (Signed)
Last OV---11/21/2019 Last RF---11/21/2019 -- Oxycodone #90 no refills UDS--02/08/19 CSC--11/29/2018

## 2020-01-03 ENCOUNTER — Other Ambulatory Visit: Payer: Self-pay | Admitting: Family Medicine

## 2020-01-03 MED ORDER — ESCITALOPRAM OXALATE 20 MG PO TABS: 20.0000 mg | ORAL_TABLET | Freq: Every day | ORAL | 3 refills | Status: AC

## 2020-01-05 ENCOUNTER — Encounter: Payer: Self-pay | Admitting: *Deleted

## 2020-01-05 ENCOUNTER — Telehealth: Payer: Self-pay | Admitting: *Deleted

## 2020-01-05 NOTE — Telephone Encounter (Addendum)
[employer]im[employer] PA, key: [id number], [id number], [id number]. Your information has been sent to [facility]. [facility] is reviewing your PA request. Typically an electronic response will be received within 72 hours.

## 2020-01-05 NOTE — Telephone Encounter (Addendum)
[employer]- [facility]  approved 01/05/2020 - 01/04/2021. Member ID: [id number]  Case number: [id number]. Sent her my chart to advise.

## 2020-01-10 DIAGNOSIS — F410 Panic disorder [episodic paroxysmal anxiety] without agoraphobia: Secondary | ICD-10-CM | POA: Diagnosis not present

## 2020-01-10 DIAGNOSIS — F4310 Post-traumatic stress disorder, unspecified: Secondary | ICD-10-CM | POA: Diagnosis not present

## 2020-01-10 DIAGNOSIS — F310 Bipolar disorder, current episode hypomanic: Secondary | ICD-10-CM | POA: Diagnosis not present

## 2020-01-13 DIAGNOSIS — F410 Panic disorder [episodic paroxysmal anxiety] without agoraphobia: Secondary | ICD-10-CM | POA: Diagnosis not present

## 2020-01-13 DIAGNOSIS — F310 Bipolar disorder, current episode hypomanic: Secondary | ICD-10-CM | POA: Diagnosis not present

## 2020-01-13 DIAGNOSIS — F4310 Post-traumatic stress disorder, unspecified: Secondary | ICD-10-CM | POA: Diagnosis not present

## 2020-01-17 ENCOUNTER — Other Ambulatory Visit: Payer: Self-pay

## 2020-01-17 ENCOUNTER — Telehealth (INDEPENDENT_AMBULATORY_CARE_PROVIDER_SITE_OTHER): Payer: BC Managed Care – PPO | Admitting: Family Medicine

## 2020-01-17 ENCOUNTER — Encounter: Payer: Self-pay | Admitting: Family Medicine

## 2020-01-17 DIAGNOSIS — G894 Chronic pain syndrome: Secondary | ICD-10-CM

## 2020-01-17 DIAGNOSIS — F4310 Post-traumatic stress disorder, unspecified: Secondary | ICD-10-CM | POA: Diagnosis not present

## 2020-01-17 MED ORDER — OXYCODONE HCL 10 MG PO TABS: 5.0000 mg | ORAL_TABLET | Freq: Four times a day (QID) | ORAL | 0 refills | Status: DC | PRN

## 2020-01-17 NOTE — Progress Notes (Signed)
Chief Complaint  Patient presents with  . Follow-up    Psy referral discuss    Subjective: Patient is a 45 y.o. female here for f/u. Due to COVID-19 pandemic, we are interacting via web portal for an electronic face-to-face visit. I verified patient's ID using 2 identifiers. Patient agreed to proceed with visit via this method. Patient is at home, I am at office. Patient and I are present for visit.   Patient has a history of PTSD and mood disorder.  She is currently on Lexapro 20 mg daily, Abilify 2 mg daily, and Cymbalta 60 mg daily.  She was recently started on Klonopin by the psychiatry team.  She feels improved.  She was told that her pain needs to improve before her mood can improve.  Patient has a history of chronic pain syndrome.  She was seeing orthopedic surgery for shoulder issues and they drew some blood.  They checked for autoimmune issues and told her that she needs to see a rheumatologist.  They told her that she could simply call.  I do not have access to the results.  She has been taking oxycodone for diffuse pain, many times focusing on the lower back and SI joint.  She no showed for an appointment with her recent pain clinic and was discharged.  She needs a new one.  Past Medical History:  Diagnosis Date  . AC (acromioclavicular) joint bone spurs, unspecified laterality    in neck  . Allergy   . Depression   . Eating disorder   . Fibromyalgia   . Hay fever   . History of chicken pox   . History of fainting spells of unknown cause   . Migraine   . Osteoarthritis   . Osteopenia   . Prediabetes   . PTSD (post-traumatic stress disorder)   . Sacroiliitis (HCC)   . Scheurmann's disease     Objective: No conversational dyspnea Age appropriate judgment and insight Nml affect and mood  Assessment and Plan: PTSD (post-traumatic stress disorder)  Chronic pain syndrome - Plan: Ambulatory referral to Pain Clinic  1. Appreciate psychiatry. 2. Will refer to pain  management again. Will refill Oxy for now. I do not want to manage this long term as her pcp, hopefully treating #1 will improve her pain.  F/u in 6 mo for CPE or prn. The patient voiced understanding and agreement to the plan.  Jilda Roche [place], DO 01/17/20  11:53 AM

## 2020-02-03 ENCOUNTER — Other Ambulatory Visit: Payer: Self-pay | Admitting: *Deleted

## 2020-02-03 DIAGNOSIS — M2550 Pain in unspecified joint: Secondary | ICD-10-CM

## 2020-02-03 MED ORDER — MELOXICAM 15 MG PO TABS: ORAL_TABLET | ORAL | 10 refills | Status: AC

## 2020-02-10 DIAGNOSIS — F310 Bipolar disorder, current episode hypomanic: Secondary | ICD-10-CM | POA: Diagnosis not present

## 2020-02-10 DIAGNOSIS — F410 Panic disorder [episodic paroxysmal anxiety] without agoraphobia: Secondary | ICD-10-CM | POA: Diagnosis not present

## 2020-02-10 DIAGNOSIS — F4310 Post-traumatic stress disorder, unspecified: Secondary | ICD-10-CM | POA: Diagnosis not present

## 2020-02-14 ENCOUNTER — Telehealth: Payer: Self-pay | Admitting: Family Medicine

## 2020-02-14 DIAGNOSIS — G894 Chronic pain syndrome: Secondary | ICD-10-CM

## 2020-02-14 MED ORDER — OXYCODONE HCL 10 MG PO TABS: 5.0000 mg | ORAL_TABLET | Freq: Four times a day (QID) | ORAL | 0 refills | Status: DC | PRN

## 2020-02-14 NOTE — Telephone Encounter (Signed)
Patient called in reference to medication refill for Oxychodone, patient would like to remind the provider that they discuss winging off medication

## 2020-02-14 NOTE — Telephone Encounter (Signed)
Refill request for Oxycodone Last OV--01/17/2020 Last RF--01/17/2020   #90 UDS--02/08/19 CSC---11/29/2018

## 2020-02-22 DIAGNOSIS — F310 Bipolar disorder, current episode hypomanic: Secondary | ICD-10-CM | POA: Diagnosis not present

## 2020-02-22 DIAGNOSIS — F4310 Post-traumatic stress disorder, unspecified: Secondary | ICD-10-CM | POA: Diagnosis not present

## 2020-02-22 DIAGNOSIS — F410 Panic disorder [episodic paroxysmal anxiety] without agoraphobia: Secondary | ICD-10-CM | POA: Diagnosis not present

## 2020-02-27 DIAGNOSIS — U071 COVID-19: Secondary | ICD-10-CM | POA: Diagnosis not present

## 2020-02-28 ENCOUNTER — Encounter: Payer: Self-pay | Admitting: Family Medicine

## 2020-02-28 ENCOUNTER — Other Ambulatory Visit: Payer: Self-pay

## 2020-02-28 ENCOUNTER — Telehealth (INDEPENDENT_AMBULATORY_CARE_PROVIDER_SITE_OTHER): Payer: BC Managed Care – PPO | Admitting: Family Medicine

## 2020-02-28 DIAGNOSIS — U071 COVID-19: Secondary | ICD-10-CM | POA: Diagnosis not present

## 2020-02-28 MED ORDER — PROMETHAZINE HCL 25 MG PO TABS: 25.0000 mg | ORAL_TABLET | Freq: Three times a day (TID) | ORAL | 0 refills | Status: AC | PRN

## 2020-02-28 MED ORDER — PREDNISONE 20 MG PO TABS: 40.0000 mg | ORAL_TABLET | Freq: Every day | ORAL | 0 refills | Status: AC

## 2020-02-28 NOTE — Progress Notes (Signed)
Chief Complaint  Patient presents with  . Covid Positive  . Headache  . Ear Pain    Gail Watkins here for URI complaints. Due to COVID-19 pandemic, we are interacting via web portal for an electronic face-to-face visit. I verified patient's ID using 2 identifiers. Patient agreed to proceed with visit via this method. Patient is at home, I am at office. Patient and I are present for visit.   Duration: 2 days  Associated symptoms: sinus congestion, ear fullness, ear pain, wheezing, myalgia and coughing, diarrhea, some nausea Denies: sinus pain, rhinorrhea, itchy watery eyes, ear drainage, shortness of breath and fevers, vomiting Treatment to date: SABA Sick contacts: Yes- BIL Tested + for covid today, 02/28/20.  Past Medical History:  Diagnosis Date  . AC (acromioclavicular) joint bone spurs, unspecified laterality    in neck  . Allergy   . Depression   . Eating disorder   . Fibromyalgia   . Hay fever   . History of chicken pox   . History of fainting spells of unknown cause   . Migraine   . Osteoarthritis   . Osteopenia   . Prediabetes   . PTSD (post-traumatic stress disorder)   . Sacroiliitis (HCC)   . Scheurmann's disease    Objective No conversational dyspnea Age appropriate judgment and insight Nml affect and mood  COVID-19 - Plan: predniSONE (DELTASONE) 20 MG tablet, promethazine (PHENERGAN) 25 MG tablet  5 d pred burst, 40 mg/d. Phenergan prn nausea.  Continue to push fluids, practice good hand hygiene, cover mouth when coughing. F/u prn. If starting to experience irreplaceable fluid loss, shaking, or shortness of breath, seek immediate care. Pt voiced understanding and agreement to the plan.  Jilda Roche [place], DO 02/28/20 1:49 PM

## 2020-03-09 DIAGNOSIS — F410 Panic disorder [episodic paroxysmal anxiety] without agoraphobia: Secondary | ICD-10-CM | POA: Diagnosis not present

## 2020-03-09 DIAGNOSIS — F310 Bipolar disorder, current episode hypomanic: Secondary | ICD-10-CM | POA: Diagnosis not present

## 2020-03-12 ENCOUNTER — Telehealth: Payer: Self-pay | Admitting: Family Medicine

## 2020-03-12 NOTE — Telephone Encounter (Signed)
Medication: Oxycodone HCl 10 MG TABS [[id number]]    Has the patient contacted their pharmacy? No. (If no, request that the patient contact the pharmacy for the refill.) (If yes, when and what did the pharmacy advise?)  Preferred Pharmacy (with phone number or street name):   [facility] [place] - [address]  [address], [place] [address]  Phone:  [phone] Fax:  [phone]  DEA #:  -- Agent: Please be advised that RX refills may take up to 3 business days. We ask that you follow-up with your pharmacy.

## 2020-03-13 ENCOUNTER — Other Ambulatory Visit: Payer: Self-pay

## 2020-03-13 DIAGNOSIS — G894 Chronic pain syndrome: Secondary | ICD-10-CM

## 2020-03-13 MED ORDER — OXYCODONE HCL 10 MG PO TABS: 5.0000 mg | ORAL_TABLET | Freq: Four times a day (QID) | ORAL | 0 refills | Status: DC | PRN

## 2020-03-13 NOTE — Telephone Encounter (Signed)
Rx refill encounter routed to Dr Carmelia Roller.

## 2020-03-13 NOTE — Telephone Encounter (Signed)
Requesting: Oxycodone 10 mg Contract: 11/29/2018 UDS: 02-08-19  Last Visit: 11/21/2019 Next Visit: none pending Last Refill: 02/14/2020  Please Advise

## 2020-03-15 ENCOUNTER — Other Ambulatory Visit: Payer: Self-pay | Admitting: Diagnostic Neuroimaging

## 2020-03-15 DIAGNOSIS — G44301 Post-traumatic headache, unspecified, intractable: Secondary | ICD-10-CM

## 2020-04-09 DIAGNOSIS — F4310 Post-traumatic stress disorder, unspecified: Secondary | ICD-10-CM | POA: Diagnosis not present

## 2020-04-09 DIAGNOSIS — F410 Panic disorder [episodic paroxysmal anxiety] without agoraphobia: Secondary | ICD-10-CM | POA: Diagnosis not present

## 2020-04-09 DIAGNOSIS — F310 Bipolar disorder, current episode hypomanic: Secondary | ICD-10-CM | POA: Diagnosis not present

## 2020-04-11 ENCOUNTER — Telehealth: Payer: Self-pay | Admitting: Family Medicine

## 2020-04-11 DIAGNOSIS — G894 Chronic pain syndrome: Secondary | ICD-10-CM

## 2020-04-11 MED ORDER — OXYCODONE HCL 10 MG PO TABS: 5.0000 mg | ORAL_TABLET | Freq: Four times a day (QID) | ORAL | 0 refills | Status: DC | PRN

## 2020-04-11 NOTE — Telephone Encounter (Signed)
Refill request for Oxycodone Last OV---02/28/20 Last RF--#90 no refills on 03/13/2020 CSC--11/29/2018 UDS---02/08/2019

## 2020-04-26 ENCOUNTER — Other Ambulatory Visit: Payer: Self-pay | Admitting: Diagnostic Neuroimaging

## 2020-05-02 ENCOUNTER — Telehealth: Payer: Self-pay | Admitting: Diagnostic Neuroimaging

## 2020-05-02 ENCOUNTER — Other Ambulatory Visit: Payer: Self-pay | Admitting: Family Medicine

## 2020-05-02 ENCOUNTER — Encounter: Payer: Self-pay | Admitting: *Deleted

## 2020-05-02 DIAGNOSIS — G44301 Post-traumatic headache, unspecified, intractable: Secondary | ICD-10-CM

## 2020-05-02 MED ORDER — TOPIRAMATE 100 MG PO TABS: 100.0000 mg | ORAL_TABLET | Freq: Two times a day (BID) | ORAL | 0 refills | Status: DC

## 2020-05-02 MED ORDER — AIMOVIG 70 MG/ML ~~LOC~~ SOAJ: 70.0000 mg | SUBCUTANEOUS | 2 refills | Status: DC

## 2020-05-02 MED ORDER — OMEPRAZOLE 40 MG PO CPDR
40.0000 mg | DELAYED_RELEASE_CAPSULE | Freq: Every day | ORAL | 1 refills | Status: AC
Start: 2020-05-02 — End: ?

## 2020-05-02 NOTE — Telephone Encounter (Signed)
..   Pt understands that although there may be some limitations with this type of visit, we will take all precautions to reduce any security or privacy concerns.  Pt understands that this will be treated like an in office visit and we will file with pt's insurance, and there may be a patient responsible charge related to this service. ? ?

## 2020-05-02 NOTE — Telephone Encounter (Signed)
Pt has scheduled her annual f/u, pt is asking for a refill on topiramate (TOPAMAX) 100 MG tablet to [facility]  & Erenumab-aooe (AIMOVIG) 70 MG/ML SOAJ to [facility] [phone]

## 2020-05-02 NOTE — Addendum Note (Signed)
Addended by: [place] Pink on: 05/02/2020 03:48 PM   Modules accepted: Orders

## 2020-05-07 DIAGNOSIS — F410 Panic disorder [episodic paroxysmal anxiety] without agoraphobia: Secondary | ICD-10-CM | POA: Diagnosis not present

## 2020-05-07 DIAGNOSIS — F310 Bipolar disorder, current episode hypomanic: Secondary | ICD-10-CM | POA: Diagnosis not present

## 2020-05-07 DIAGNOSIS — F4310 Post-traumatic stress disorder, unspecified: Secondary | ICD-10-CM | POA: Diagnosis not present

## 2020-05-09 ENCOUNTER — Telehealth: Payer: Self-pay | Admitting: Family Medicine

## 2020-05-09 NOTE — Telephone Encounter (Signed)
What is the status of the pain clinic referral? I don't intend to fill this long term.

## 2020-05-09 NOTE — Telephone Encounter (Signed)
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 She states she has a lot going on. Her last referral was put in for [facility] in January.  She said they are not taking new patients. She stated at her last appt. PCP stated ok to fill this for her since psy doing all others.

## 2020-05-09 NOTE — Telephone Encounter (Signed)
I stated I would bridge her until she sets up with pain but do not want to rx this long term. She needs a pain clinic. Let's reach out to [facility] to see if they are now taking new patients and get her in. Ty.

## 2020-05-09 NOTE — Telephone Encounter (Signed)
Refill Request for Oxycodone Last OV--02/28/20 Last RF--#90 with 0 refills on 04/11/2020 UDS--02/08/2019 CSC--11/29/2018

## 2020-05-11 ENCOUNTER — Other Ambulatory Visit: Payer: Self-pay | Admitting: Family Medicine

## 2020-05-11 DIAGNOSIS — G894 Chronic pain syndrome: Secondary | ICD-10-CM

## 2020-05-11 MED ORDER — OXYCODONE HCL 10 MG PO TABS: 5.0000 mg | ORAL_TABLET | Freq: Four times a day (QID) | ORAL | 0 refills | Status: DC | PRN

## 2020-05-11 NOTE — Telephone Encounter (Signed)
[facility] and the patient is a current patient there.  She has not been seen since 07/13/2019. All she has to do is call to schedule an appointment.  Yes they do accept New patients, but she is an existing patient so just has to call them.

## 2020-05-11 NOTE — Telephone Encounter (Signed)
Called informed the patient prescription sent in and that she needs to make appt with [facility] asap (gave her their number to call for appt/[phone]). She verbalized agreement to do so.

## 2020-05-11 NOTE — Telephone Encounter (Signed)
Called and informed the patient to make appt at pain management. She asked if you were not going to fill her oxycodone as without it she cannot go to her appointment

## 2020-06-05 DIAGNOSIS — F4310 Post-traumatic stress disorder, unspecified: Secondary | ICD-10-CM | POA: Diagnosis not present

## 2020-06-05 DIAGNOSIS — F410 Panic disorder [episodic paroxysmal anxiety] without agoraphobia: Secondary | ICD-10-CM | POA: Diagnosis not present

## 2020-06-05 DIAGNOSIS — F310 Bipolar disorder, current episode hypomanic: Secondary | ICD-10-CM | POA: Diagnosis not present

## 2020-06-07 NOTE — Telephone Encounter (Signed)
The patient claims she called [facility] and was told she was no longer welcome. states she does not know what to do next.

## 2020-06-08 NOTE — Telephone Encounter (Signed)
Called the patient informed of PCP instructions. She is completely stating this is not true and stated they will not see her at all. Kept me on the phone for almost 5 minutes regarding stating this is not true. The patient refuses to call them.

## 2020-06-08 NOTE — Telephone Encounter (Signed)
Gail Watkins, I want to discharge her. The noncompliance with refusing to call and also the inconsistency with what she is telling us vs the other offices as we have referred her to numerous pain clinics. The latter has now caused a breakdown in the DPR that is irreparable and I can no longer objectively and effectively treat her. Thanks.

## 2020-06-08 NOTE — Telephone Encounter (Signed)
I called and spoke to a scheduler at [facility].  She was not told she could not return, but she can schedule an appointment there but would need to schedule with another provider. She was never told she could not return.

## 2020-06-12 NOTE — Telephone Encounter (Signed)
Patient is calling in regards to her medication. Patient wants to know what is the status of her medication

## 2020-06-12 NOTE — Telephone Encounter (Signed)
Last OV--02/28/20 Last RF--05/11/2020  #90  UDS-02/08/19 CSC-11/29/2018

## 2020-06-13 ENCOUNTER — Encounter: Payer: Self-pay | Admitting: Family Medicine

## 2020-06-13 MED ORDER — OXYCODONE HCL 10 MG PO TABS: 5.0000 mg | ORAL_TABLET | Freq: Three times a day (TID) | ORAL | 0 refills | Status: AC | PRN

## 2020-06-13 NOTE — Addendum Note (Signed)
Addended by: Radene Gunning on: 06/13/2020 11:22 AM   Modules accepted: Orders

## 2020-06-13 NOTE — Telephone Encounter (Signed)
Called informed per PCP instructions he would refill today. She stated had already received the email she is being discharged from the practice, She did state she would hope he would reconsider and that Toma Copier will not see her. Again she was informed her medication would be filled today to cover her for 30 days

## 2020-06-26 ENCOUNTER — Telehealth: Payer: Self-pay | Admitting: Family Medicine

## 2020-06-26 NOTE — Telephone Encounter (Signed)
Pt was resch from 7/6 but will need refills of Topimax and Aimovig before then. Best call back is [phone]

## 2020-06-27 ENCOUNTER — Other Ambulatory Visit: Payer: Self-pay

## 2020-06-27 DIAGNOSIS — G44301 Post-traumatic headache, unspecified, intractable: Secondary | ICD-10-CM

## 2020-06-27 MED ORDER — AIMOVIG 70 MG/ML ~~LOC~~ SOAJ: 70.0000 mg | SUBCUTANEOUS | 2 refills | Status: DC

## 2020-06-27 MED ORDER — TOPIRAMATE 100 MG PO TABS: 100.0000 mg | ORAL_TABLET | Freq: Two times a day (BID) | ORAL | 0 refills | Status: DC

## 2020-06-27 NOTE — Telephone Encounter (Signed)
Spoke to pt, confirmed pharmacy, refills sent.

## 2020-07-03 DIAGNOSIS — F4310 Post-traumatic stress disorder, unspecified: Secondary | ICD-10-CM | POA: Diagnosis not present

## 2020-07-03 DIAGNOSIS — F310 Bipolar disorder, current episode hypomanic: Secondary | ICD-10-CM | POA: Diagnosis not present

## 2020-07-03 DIAGNOSIS — F410 Panic disorder [episodic paroxysmal anxiety] without agoraphobia: Secondary | ICD-10-CM | POA: Diagnosis not present

## 2020-07-11 ENCOUNTER — Telehealth: Payer: BC Managed Care – PPO | Admitting: Family Medicine

## 2020-07-31 DIAGNOSIS — F410 Panic disorder [episodic paroxysmal anxiety] without agoraphobia: Secondary | ICD-10-CM | POA: Diagnosis not present

## 2020-07-31 DIAGNOSIS — F4310 Post-traumatic stress disorder, unspecified: Secondary | ICD-10-CM | POA: Diagnosis not present

## 2020-07-31 DIAGNOSIS — F310 Bipolar disorder, current episode hypomanic: Secondary | ICD-10-CM | POA: Diagnosis not present
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
## 2020-08-07 ENCOUNTER — Encounter: Payer: Self-pay | Admitting: Family Medicine

## 2020-08-07 ENCOUNTER — Telehealth (INDEPENDENT_AMBULATORY_CARE_PROVIDER_SITE_OTHER): Payer: Medicaid Other | Admitting: Family Medicine

## 2020-08-07 DIAGNOSIS — G44301 Post-traumatic headache, unspecified, intractable: Secondary | ICD-10-CM

## 2020-08-07 DIAGNOSIS — G44329 Chronic post-traumatic headache, not intractable: Secondary | ICD-10-CM | POA: Diagnosis not present

## 2020-08-07 MED ORDER — TOPIRAMATE 100 MG PO TABS: 100.0000 mg | ORAL_TABLET | Freq: Two times a day (BID) | ORAL | 3 refills | Status: DC

## 2020-08-07 MED ORDER — RIZATRIPTAN BENZOATE 10 MG PO TBDP: 10.0000 mg | ORAL_TABLET | ORAL | 11 refills | Status: DC | PRN

## 2020-08-07 MED ORDER — AIMOVIG 70 MG/ML ~~LOC~~ SOAJ: 70.0000 mg | SUBCUTANEOUS | 3 refills | Status: DC

## 2020-08-28 DIAGNOSIS — F410 Panic disorder [episodic paroxysmal anxiety] without agoraphobia: Secondary | ICD-10-CM | POA: Diagnosis not present

## 2020-08-28 DIAGNOSIS — F4310 Post-traumatic stress disorder, unspecified: Secondary | ICD-10-CM | POA: Diagnosis not present

## 2020-08-28 DIAGNOSIS — F310 Bipolar disorder, current episode hypomanic: Secondary | ICD-10-CM | POA: Diagnosis not present

## 2020-09-25 DIAGNOSIS — F410 Panic disorder [episodic paroxysmal anxiety] without agoraphobia: Secondary | ICD-10-CM | POA: Diagnosis not present

## 2020-09-25 DIAGNOSIS — F310 Bipolar disorder, current episode hypomanic: Secondary | ICD-10-CM | POA: Diagnosis not present

## 2020-09-25 DIAGNOSIS — F4310 Post-traumatic stress disorder, unspecified: Secondary | ICD-10-CM | POA: Diagnosis not present

## 2020-10-11 DIAGNOSIS — M545 Low back pain, unspecified: Secondary | ICD-10-CM | POA: Diagnosis not present

## 2020-10-19 DIAGNOSIS — M545 Low back pain, unspecified: Secondary | ICD-10-CM | POA: Diagnosis not present

## 2020-10-19 DIAGNOSIS — M7501 Adhesive capsulitis of right shoulder: Secondary | ICD-10-CM | POA: Diagnosis not present

## 2020-10-19 DIAGNOSIS — M47816 Spondylosis without myelopathy or radiculopathy, lumbar region: Secondary | ICD-10-CM | POA: Diagnosis not present

## 2020-10-19 DIAGNOSIS — M7551 Bursitis of right shoulder: Secondary | ICD-10-CM | POA: Diagnosis not present

## 2020-10-22 ENCOUNTER — Other Ambulatory Visit: Payer: Self-pay | Admitting: *Deleted

## 2020-10-22 DIAGNOSIS — G44301 Post-traumatic headache, unspecified, intractable: Secondary | ICD-10-CM

## 2020-10-22 MED ORDER — AIMOVIG 70 MG/ML ~~LOC~~ SOAJ: 70.0000 mg | SUBCUTANEOUS | 3 refills | Status: AC

## 2020-10-22 MED ORDER — RIZATRIPTAN BENZOATE 10 MG PO TBDP: 10.0000 mg | ORAL_TABLET | ORAL | 11 refills | Status: AC | PRN

## 2020-10-22 MED ORDER — TOPIRAMATE 100 MG PO TABS
100.0000 mg | ORAL_TABLET | Freq: Two times a day (BID) | ORAL | 3 refills | Status: AC
Start: 2020-10-22 — End: ?

## 2020-10-23 DIAGNOSIS — F4310 Post-traumatic stress disorder, unspecified: Secondary | ICD-10-CM | POA: Diagnosis not present

## 2020-10-23 DIAGNOSIS — F410 Panic disorder [episodic paroxysmal anxiety] without agoraphobia: Secondary | ICD-10-CM | POA: Diagnosis not present

## 2020-10-23 DIAGNOSIS — F310 Bipolar disorder, current episode hypomanic: Secondary | ICD-10-CM | POA: Diagnosis not present

## 2020-10-25 ENCOUNTER — Telehealth: Payer: Self-pay | Admitting: *Deleted

## 2020-10-25 NOTE — Telephone Encounter (Signed)
Submitted PA Aimovig on CMM. Key: [id number]. Waiting on determination from [facility].

## 2020-10-26 ENCOUNTER — Telehealth: Payer: Self-pay
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
## 2020-10-30 NOTE — Telephone Encounter (Signed)
See notes below, the denial was d/t pt having other prescription coverage. There is an approval on file via prescription coverage benefit. Nothing further needed.

## 2020-10-30 NOTE — Telephone Encounter (Signed)
[facility]) called, following up on PA for Aimovig. PA was denied by pharmacy benefit. Would need to call the number on the back of member prescription card. Do not need a call back.

## 2020-11-20 DIAGNOSIS — F410 Panic disorder [episodic paroxysmal anxiety] without agoraphobia: Secondary | ICD-10-CM | POA: Diagnosis not present

## 2020-11-20 DIAGNOSIS — F310 Bipolar disorder, current episode hypomanic: Secondary | ICD-10-CM | POA: Diagnosis not present

## 2020-11-20 DIAGNOSIS — F4310 Post-traumatic stress disorder, unspecified: Secondary | ICD-10-CM | POA: Diagnosis not present

## 2020-11-22 DIAGNOSIS — M47816 Spondylosis without myelopathy or radiculopathy, lumbar region: Secondary | ICD-10-CM | POA: Diagnosis not present

## 2020-11-23 DIAGNOSIS — Z20822 Contact with and (suspected) exposure to covid-19: Secondary | ICD-10-CM | POA: Diagnosis not present

## 2020-12-06 DIAGNOSIS — Z20822 Contact with and (suspected) exposure to covid-19: Secondary | ICD-10-CM | POA: Diagnosis not present

## 2020-12-14 DIAGNOSIS — F32A Depression, unspecified: Secondary | ICD-10-CM | POA: Diagnosis not present

## 2020-12-14 DIAGNOSIS — Z20822 Contact with and (suspected) exposure to covid-19: Secondary | ICD-10-CM | POA: Diagnosis not present

## 2020-12-14 DIAGNOSIS — R059 Cough, unspecified: Secondary | ICD-10-CM | POA: Diagnosis not present

## 2020-12-14 DIAGNOSIS — J101 Influenza due to other identified influenza virus with other respiratory manifestations: Secondary | ICD-10-CM | POA: Diagnosis not present

## 2020-12-14 DIAGNOSIS — R0602 Shortness of breath: Secondary | ICD-10-CM | POA: Diagnosis not present

## 2020-12-17 DIAGNOSIS — F4310 Post-traumatic stress disorder, unspecified: Secondary | ICD-10-CM | POA: Diagnosis not present

## 2020-12-17 DIAGNOSIS — F310 Bipolar disorder, current episode hypomanic: Secondary | ICD-10-CM | POA: Diagnosis not present

## 2020-12-17 DIAGNOSIS — F3111 Bipolar disorder, current episode manic without psychotic features, mild: Secondary | ICD-10-CM | POA: Diagnosis not present

## 2020-12-17 DIAGNOSIS — F410 Panic disorder [episodic paroxysmal anxiety] without agoraphobia: Secondary | ICD-10-CM | POA: Diagnosis not present

## 2021-01-02 DIAGNOSIS — F4310 Post-traumatic stress disorder, unspecified: Secondary | ICD-10-CM | POA: Diagnosis not present

## 2021-01-02 DIAGNOSIS — F3110 Bipolar disorder, current episode manic without psychotic features, unspecified: Secondary | ICD-10-CM | POA: Diagnosis not present

## 2021-01-02 DIAGNOSIS — F310 Bipolar disorder, current episode hypomanic: Secondary | ICD-10-CM | POA: Diagnosis not present

## 2021-01-02 DIAGNOSIS — F410 Panic disorder [episodic paroxysmal anxiety] without agoraphobia: Secondary | ICD-10-CM | POA: Diagnosis not present

## 2021-01-07 DIAGNOSIS — Z20822 Contact with and (suspected) exposure to covid-19: Secondary | ICD-10-CM | POA: Diagnosis not present

## 2021-01-14 DIAGNOSIS — F4310 Post-traumatic stress disorder, unspecified: Secondary | ICD-10-CM | POA: Diagnosis not present

## 2021-01-14 DIAGNOSIS — F310 Bipolar disorder, current episode hypomanic: Secondary | ICD-10-CM | POA: Diagnosis not present

## 2021-01-14 DIAGNOSIS — F410 Panic disorder [episodic paroxysmal anxiety] without agoraphobia: Secondary | ICD-10-CM | POA: Diagnosis not present

## 2021-01-15 ENCOUNTER — Telehealth: Payer: Self-pay | Admitting: Neurology

## 2021-01-15 NOTE — Telephone Encounter (Signed)
Request Reference Number: [id number]. AIMOVIG INJ 70MG /ML is approved through 01/15/2022. Your patient may now fill this prescription and it will be covered.

## 2021-01-15 NOTE — Telephone Encounter (Signed)
PA submitted for the pt on CMM/opt[facility] rx KEY: [id number] Will await response

## 2021-01-29 DIAGNOSIS — M47816 Spondylosis without myelopathy or radiculopathy, lumbar region: Secondary | ICD-10-CM | POA: Diagnosis not present

## 2021-02-14 DIAGNOSIS — F411 Generalized anxiety disorder: Secondary | ICD-10-CM | POA: Diagnosis not present

## 2021-02-14 DIAGNOSIS — F4310 Post-traumatic stress disorder, unspecified: Secondary | ICD-10-CM | POA: Diagnosis not present

## 2021-02-14 DIAGNOSIS — F909 Attention-deficit hyperactivity disorder, unspecified type: Secondary | ICD-10-CM | POA: Diagnosis not present

## 2021-02-18 DIAGNOSIS — F310 Bipolar disorder, current episode hypomanic: Secondary | ICD-10-CM | POA: Diagnosis not present

## 2021-02-18 DIAGNOSIS — F410 Panic disorder [episodic paroxysmal anxiety] without agoraphobia: Secondary | ICD-10-CM | POA: Diagnosis not present

## 2021-02-18 DIAGNOSIS — F4310 Post-traumatic stress disorder, unspecified: Secondary | ICD-10-CM | POA: Diagnosis not present

## 2021-02-23 DIAGNOSIS — S90425A Blister (nonthermal), left lesser toe(s), initial encounter: Secondary | ICD-10-CM | POA: Diagnosis not present

## 2021-02-23 DIAGNOSIS — X58XXXA Exposure to other specified factors, initial encounter: Secondary | ICD-10-CM | POA: Diagnosis not present

## 2021-02-23 DIAGNOSIS — M79672 Pain in left foot: Secondary | ICD-10-CM | POA: Diagnosis not present

## 2021-03-02 DIAGNOSIS — L0390 Cellulitis, unspecified: Secondary | ICD-10-CM | POA: Diagnosis not present

## 2021-03-02 DIAGNOSIS — R21 Rash and other nonspecific skin eruption: Secondary | ICD-10-CM | POA: Diagnosis not present

## 2021-03-02 DIAGNOSIS — L309 Dermatitis, unspecified: Secondary | ICD-10-CM | POA: Diagnosis not present

## 2021-03-02 DIAGNOSIS — L03116 Cellulitis of left lower limb: Secondary | ICD-10-CM | POA: Diagnosis not present

## 2021-03-18 DIAGNOSIS — F310 Bipolar disorder, current episode hypomanic: Secondary | ICD-10-CM | POA: Diagnosis not present

## 2021-03-18 DIAGNOSIS — F4310 Post-traumatic stress disorder, unspecified: Secondary | ICD-10-CM | POA: Diagnosis not present

## 2021-03-18 DIAGNOSIS — F410 Panic disorder [episodic paroxysmal anxiety] without agoraphobia: Secondary | ICD-10-CM | POA: Diagnosis not present

## 2021-04-10 DIAGNOSIS — X58XXXA Exposure to other specified factors, initial encounter: Secondary | ICD-10-CM | POA: Diagnosis not present

## 2021-04-10 DIAGNOSIS — S4992XA Unspecified injury of left shoulder and upper arm, initial encounter: Secondary | ICD-10-CM | POA: Diagnosis not present

## 2021-04-10 DIAGNOSIS — Z5321 Procedure and treatment not carried out due to patient leaving prior to being seen by health care provider: Secondary | ICD-10-CM | POA: Diagnosis not present

## 2021-04-22 DIAGNOSIS — F310 Bipolar disorder, current episode hypomanic: Secondary | ICD-10-CM | POA: Diagnosis not present

## 2021-04-22 DIAGNOSIS — F410 Panic disorder [episodic paroxysmal anxiety] without agoraphobia: Secondary | ICD-10-CM | POA: Diagnosis not present

## 2021-04-22 DIAGNOSIS — F4310 Post-traumatic stress disorder, unspecified: Secondary | ICD-10-CM | POA: Diagnosis not present

## 2021-05-24 DIAGNOSIS — F900 Attention-deficit hyperactivity disorder, predominantly inattentive type: Secondary | ICD-10-CM | POA: Diagnosis not present

## 2021-05-24 DIAGNOSIS — F310 Bipolar disorder, current episode hypomanic: Secondary | ICD-10-CM | POA: Diagnosis not present

## 2021-05-24 DIAGNOSIS — F410 Panic disorder [episodic paroxysmal anxiety] without agoraphobia: Secondary | ICD-10-CM | POA: Diagnosis not present

## 2021-05-24 DIAGNOSIS — F4310 Post-traumatic stress disorder, unspecified: Secondary | ICD-10-CM | POA: Diagnosis not present

## 2021-06-13 DIAGNOSIS — F310 Bipolar disorder, current episode hypomanic: Secondary | ICD-10-CM | POA: Diagnosis not present

## 2021-06-13 DIAGNOSIS — F410 Panic disorder [episodic paroxysmal anxiety] without agoraphobia: Secondary | ICD-10-CM | POA: Diagnosis not present

## 2021-06-13 DIAGNOSIS — F900 Attention-deficit hyperactivity disorder, predominantly inattentive type: Secondary | ICD-10-CM | POA: Diagnosis not present

## 2021-06-13 DIAGNOSIS — F4312 Post-traumatic stress disorder, chronic: Secondary | ICD-10-CM | POA: Diagnosis not present

## 2021-07-11 DIAGNOSIS — F4310 Post-traumatic stress disorder, unspecified: Secondary | ICD-10-CM | POA: Diagnosis not present

## 2021-07-11 DIAGNOSIS — F410 Panic disorder [episodic paroxysmal anxiety] without agoraphobia: Secondary | ICD-10-CM | POA: Diagnosis not present

## 2021-07-11 DIAGNOSIS — F310 Bipolar disorder, current episode hypomanic: Secondary | ICD-10-CM | POA: Diagnosis not present

## 2021-07-11 DIAGNOSIS — F900 Attention-deficit hyperactivity disorder, predominantly inattentive type: Secondary | ICD-10-CM | POA: Diagnosis not present

## 2021-08-08 DIAGNOSIS — F310 Bipolar disorder, current episode hypomanic: Secondary | ICD-10-CM | POA: Diagnosis not present

## 2021-08-08 DIAGNOSIS — F4310 Post-traumatic stress disorder, unspecified: Secondary | ICD-10-CM | POA: Diagnosis not present

## 2021-08-08 DIAGNOSIS — F900 Attention-deficit hyperactivity disorder, predominantly inattentive type: Secondary | ICD-10-CM | POA: Diagnosis not present

## 2021-08-08 DIAGNOSIS — F410 Panic disorder [episodic paroxysmal anxiety] without agoraphobia: Secondary | ICD-10-CM | POA: Diagnosis not present
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
## 2021-08-13 ENCOUNTER — Telehealth: Payer: Medicaid Other | Admitting: Family Medicine

## 2021-08-13 DIAGNOSIS — G44329 Chronic post-traumatic headache, not intractable: Secondary | ICD-10-CM
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
## 2021-08-27 ENCOUNTER — Telehealth: Payer: Medicaid Other | Admitting: Family Medicine

## 2021-08-27 ENCOUNTER — Encounter: Payer: Self-pay | Admitting: Family Medicine

## 2021-08-27 DIAGNOSIS — G44301 Post-traumatic headache, unspecified, intractable: Secondary | ICD-10-CM

## 2021-08-27 DIAGNOSIS — G44329 Chronic post-traumatic headache, not intractable: Secondary | ICD-10-CM

## 2021-09-04 DIAGNOSIS — M79631 Pain in right forearm: Secondary | ICD-10-CM | POA: Diagnosis not present

## 2021-09-04 DIAGNOSIS — M25531 Pain in right wrist: Secondary | ICD-10-CM | POA: Diagnosis not present

## 2021-09-04 DIAGNOSIS — F17210 Nicotine dependence, cigarettes, uncomplicated: Secondary | ICD-10-CM | POA: Diagnosis not present

## 2021-09-04 DIAGNOSIS — M79646 Pain in unspecified finger(s): Secondary | ICD-10-CM | POA: Diagnosis not present

## 2021-09-04 DIAGNOSIS — M25521 Pain in right elbow: Secondary | ICD-10-CM | POA: Diagnosis not present

## 2021-09-05 DIAGNOSIS — F310 Bipolar disorder, current episode hypomanic: Secondary | ICD-10-CM | POA: Diagnosis not present

## 2021-09-05 DIAGNOSIS — F4310 Post-traumatic stress disorder, unspecified: Secondary | ICD-10-CM | POA: Diagnosis not present

## 2021-09-05 DIAGNOSIS — F900 Attention-deficit hyperactivity disorder, predominantly inattentive type: Secondary | ICD-10-CM | POA: Diagnosis not present

## 2021-09-05 DIAGNOSIS — F410 Panic disorder [episodic paroxysmal anxiety] without agoraphobia: Secondary | ICD-10-CM | POA: Diagnosis not present

## 2021-10-10 DIAGNOSIS — F4310 Post-traumatic stress disorder, unspecified: Secondary | ICD-10-CM | POA: Diagnosis not present

## 2021-10-10 DIAGNOSIS — F410 Panic disorder [episodic paroxysmal anxiety] without agoraphobia: Secondary | ICD-10-CM | POA: Diagnosis not present

## 2021-10-10 DIAGNOSIS — F900 Attention-deficit hyperactivity disorder, predominantly inattentive type: Secondary | ICD-10-CM | POA: Diagnosis not present

## 2021-10-10 DIAGNOSIS — F310 Bipolar disorder, current episode hypomanic: Secondary | ICD-10-CM | POA: Diagnosis not present

## 2021-10-31 DIAGNOSIS — R519 Headache, unspecified: Secondary | ICD-10-CM | POA: Diagnosis not present

## 2021-10-31 DIAGNOSIS — G8929 Other chronic pain: Secondary | ICD-10-CM | POA: Diagnosis not present

## 2021-10-31 DIAGNOSIS — R6884 Jaw pain: Secondary | ICD-10-CM | POA: Diagnosis not present

## 2021-10-31 DIAGNOSIS — M542 Cervicalgia: Secondary | ICD-10-CM | POA: Diagnosis not present

## 2024-01-15 ENCOUNTER — Encounter: Admitting: Family

## 2024-01-15 NOTE — Progress Notes (Signed)
 Erroneous encounter-disregard
